# Patient Record
Sex: Male | Born: 1945 | Race: White | Hispanic: No | Marital: Married | Smoking: Current every day smoker
Health system: Southern US, Community
[De-identification: ages and names within clinical notes are randomized; demographics above are authoritative.]

## PROBLEM LIST (undated history)

## (undated) DIAGNOSIS — N402 Nodular prostate without lower urinary tract symptoms: Secondary | ICD-10-CM

## (undated) DIAGNOSIS — F0280 Dementia in other diseases classified elsewhere without behavioral disturbance: Secondary | ICD-10-CM

## (undated) DIAGNOSIS — G309 Alzheimer's disease, unspecified: Principal | ICD-10-CM

## (undated) DIAGNOSIS — K5792 Diverticulitis of intestine, part unspecified, without perforation or abscess without bleeding: Secondary | ICD-10-CM

## (undated) HISTORY — DX: Diverticulitis of intestine, part unspecified, without perforation or abscess without bleeding: K57.92

## (undated) HISTORY — DX: Dementia in other diseases classified elsewhere, unspecified severity, without behavioral disturbance, psychotic disturbance, mood disturbance, and anxiety: F02.80

## (undated) HISTORY — DX: Alzheimer's disease, unspecified: G30.9

## (undated) HISTORY — DX: Nodular prostate without lower urinary tract symptoms: N40.2

## (undated) HISTORY — PX: SQUAMOUS CELL CARCINOMA EXCISION: SHX2433

---

## 2002-02-03 ENCOUNTER — Encounter: Admission: RE | Admit: 2002-02-03 | Discharge: 2002-02-03 | Payer: Self-pay | Admitting: Family Medicine

## 2002-02-03 ENCOUNTER — Encounter: Payer: Self-pay | Admitting: Family Medicine

## 2003-11-04 ENCOUNTER — Encounter: Admission: RE | Admit: 2003-11-04 | Discharge: 2003-11-04 | Payer: Self-pay | Admitting: Family Medicine

## 2003-11-26 HISTORY — PX: SPINE SURGERY: SHX786

## 2010-11-25 HISTORY — PX: CARPAL TUNNEL WITH CUBITAL TUNNEL: SHX5608

## 2012-02-03 DIAGNOSIS — G3184 Mild cognitive impairment, so stated: Secondary | ICD-10-CM | POA: Diagnosis not present

## 2012-02-03 DIAGNOSIS — G309 Alzheimer's disease, unspecified: Secondary | ICD-10-CM | POA: Diagnosis not present

## 2012-02-03 DIAGNOSIS — Z125 Encounter for screening for malignant neoplasm of prostate: Secondary | ICD-10-CM | POA: Diagnosis not present

## 2012-02-03 DIAGNOSIS — F0280 Dementia in other diseases classified elsewhere without behavioral disturbance: Secondary | ICD-10-CM | POA: Diagnosis not present

## 2012-03-30 DIAGNOSIS — H2510 Age-related nuclear cataract, unspecified eye: Secondary | ICD-10-CM | POA: Diagnosis not present

## 2012-03-30 DIAGNOSIS — H40009 Preglaucoma, unspecified, unspecified eye: Secondary | ICD-10-CM | POA: Diagnosis not present

## 2012-05-12 DIAGNOSIS — M25539 Pain in unspecified wrist: Secondary | ICD-10-CM | POA: Diagnosis not present

## 2012-05-26 DIAGNOSIS — M25539 Pain in unspecified wrist: Secondary | ICD-10-CM | POA: Diagnosis not present

## 2012-06-01 DIAGNOSIS — M25539 Pain in unspecified wrist: Secondary | ICD-10-CM | POA: Diagnosis not present

## 2012-06-02 DIAGNOSIS — R198 Other specified symptoms and signs involving the digestive system and abdomen: Secondary | ICD-10-CM | POA: Diagnosis not present

## 2012-06-02 DIAGNOSIS — K620 Anal polyp: Secondary | ICD-10-CM | POA: Diagnosis not present

## 2012-06-02 DIAGNOSIS — L723 Sebaceous cyst: Secondary | ICD-10-CM | POA: Diagnosis not present

## 2012-06-02 DIAGNOSIS — K6289 Other specified diseases of anus and rectum: Secondary | ICD-10-CM | POA: Diagnosis not present

## 2012-06-03 DIAGNOSIS — M25539 Pain in unspecified wrist: Secondary | ICD-10-CM | POA: Diagnosis not present

## 2012-06-08 DIAGNOSIS — M25539 Pain in unspecified wrist: Secondary | ICD-10-CM | POA: Diagnosis not present

## 2012-06-09 DIAGNOSIS — M25539 Pain in unspecified wrist: Secondary | ICD-10-CM | POA: Diagnosis not present

## 2012-06-10 DIAGNOSIS — M25539 Pain in unspecified wrist: Secondary | ICD-10-CM | POA: Diagnosis not present

## 2012-06-15 DIAGNOSIS — M25539 Pain in unspecified wrist: Secondary | ICD-10-CM | POA: Diagnosis not present

## 2012-06-20 DIAGNOSIS — F0280 Dementia in other diseases classified elsewhere without behavioral disturbance: Secondary | ICD-10-CM | POA: Diagnosis not present

## 2012-06-20 DIAGNOSIS — G309 Alzheimer's disease, unspecified: Secondary | ICD-10-CM | POA: Diagnosis not present

## 2012-06-20 DIAGNOSIS — F17200 Nicotine dependence, unspecified, uncomplicated: Secondary | ICD-10-CM | POA: Diagnosis not present

## 2012-06-20 DIAGNOSIS — R197 Diarrhea, unspecified: Secondary | ICD-10-CM | POA: Diagnosis not present

## 2012-06-20 DIAGNOSIS — E860 Dehydration: Secondary | ICD-10-CM | POA: Diagnosis not present

## 2012-06-20 DIAGNOSIS — R112 Nausea with vomiting, unspecified: Secondary | ICD-10-CM | POA: Diagnosis not present

## 2012-07-24 DIAGNOSIS — K5732 Diverticulitis of large intestine without perforation or abscess without bleeding: Secondary | ICD-10-CM | POA: Diagnosis not present

## 2012-08-14 DIAGNOSIS — G309 Alzheimer's disease, unspecified: Secondary | ICD-10-CM | POA: Diagnosis not present

## 2012-08-14 DIAGNOSIS — F0280 Dementia in other diseases classified elsewhere without behavioral disturbance: Secondary | ICD-10-CM | POA: Diagnosis not present

## 2012-12-15 DIAGNOSIS — G4700 Insomnia, unspecified: Secondary | ICD-10-CM | POA: Diagnosis not present

## 2012-12-15 DIAGNOSIS — G309 Alzheimer's disease, unspecified: Secondary | ICD-10-CM | POA: Diagnosis not present

## 2012-12-15 DIAGNOSIS — F0280 Dementia in other diseases classified elsewhere without behavioral disturbance: Secondary | ICD-10-CM | POA: Diagnosis not present

## 2012-12-15 DIAGNOSIS — K4090 Unilateral inguinal hernia, without obstruction or gangrene, not specified as recurrent: Secondary | ICD-10-CM | POA: Diagnosis not present

## 2013-03-17 DIAGNOSIS — G5600 Carpal tunnel syndrome, unspecified upper limb: Secondary | ICD-10-CM | POA: Diagnosis not present

## 2013-03-17 DIAGNOSIS — M25539 Pain in unspecified wrist: Secondary | ICD-10-CM | POA: Diagnosis not present

## 2013-03-17 DIAGNOSIS — S62109A Fracture of unspecified carpal bone, unspecified wrist, initial encounter for closed fracture: Secondary | ICD-10-CM | POA: Diagnosis not present

## 2013-07-13 ENCOUNTER — Ambulatory Visit (INDEPENDENT_AMBULATORY_CARE_PROVIDER_SITE_OTHER): Payer: Medicare Other | Admitting: Nurse Practitioner

## 2013-07-13 ENCOUNTER — Encounter: Payer: Self-pay | Admitting: Nurse Practitioner

## 2013-07-13 VITALS — BP 148/90 | HR 76 | Temp 97.6°F | Resp 18 | Ht 68.5 in | Wt 129.2 lb

## 2013-07-13 DIAGNOSIS — Z139 Encounter for screening, unspecified: Secondary | ICD-10-CM

## 2013-07-13 DIAGNOSIS — F0280 Dementia in other diseases classified elsewhere without behavioral disturbance: Secondary | ICD-10-CM

## 2013-07-13 DIAGNOSIS — F329 Major depressive disorder, single episode, unspecified: Secondary | ICD-10-CM

## 2013-07-13 DIAGNOSIS — E785 Hyperlipidemia, unspecified: Secondary | ICD-10-CM | POA: Diagnosis not present

## 2013-07-13 MED ORDER — CITALOPRAM HYDROBROMIDE 40 MG PO TABS: 40.0000 mg | ORAL_TABLET | Freq: Every day | ORAL | Status: DC

## 2013-07-13 MED ORDER — ZOSTER VACCINE LIVE 19400 UNT/0.65ML ~~LOC~~ SOLR: 0.6500 mL | Freq: Once | SUBCUTANEOUS | Status: DC

## 2013-07-13 NOTE — Patient Instructions (Signed)
Please follow up in 6 weeks for EV  Please check your immunization records for TdaP and Pneumococcal vaccines  Will give you prescription to have shingles vaccine at your pharmacy  Will get lab work today and have put in a referral to GI office for screening colonoscopy  Will increase celexa to 40 mg to help with anxiety and depression

## 2013-07-13 NOTE — Progress Notes (Signed)
Patient ID: Christian [name], male   DOB: 1946-01-27, 67 y.o.   MRN: [id number]   Allergies  Allergen Reactions  . Morphine And Related     Chief Complaint  Patient presents with  . Establish Care    HPI: Patient is a 67 y.o. male seen in the office today to establish care. Here with wife who provides most of the information. Pt with dementia, hx of this for 8 years.  Pt and wife have moved back home from [place]. pts wife has family here and she is aware she will need ongoing help with her husband as his dementia progresses.  Wife reports that she feels like he is depressed. A few months ago he was still driving and working part time. Now that he has moved that has all stopped. He sits on the couch all day and has no need to get up and do anything else. Reports he does get bored. Wife reports she sees more anger and anxiety.  When wife discussed having pt do more activities however pt got exteremly mad and angry when an adult day care was suggested. Pt has been on celexa 20 mg for several years.  Review of Systems:  Review of Systems  Constitutional: Negative for fever, chills and weight loss.  HENT: Negative for hearing loss, ear pain, sore throat and tinnitus.   Eyes: Negative.   Respiratory: Negative for cough and shortness of breath.   Cardiovascular: Negative for chest pain and palpitations.  Gastrointestinal: Positive for abdominal pain (after he drinks coffee on an empty stomach). Negative for heartburn, nausea, vomiting, diarrhea, constipation, blood in stool and melena.  Genitourinary: Negative for dysuria, urgency and frequency.  Musculoskeletal: Negative for myalgias, back pain, joint pain and falls.  Skin: Negative for itching and rash.       Skin tears easy  Neurological: Negative for dizziness, tingling, weakness and headaches.  Endo/Heme/Allergies: Positive for environmental allergies.  Psychiatric/Behavioral: Positive for memory loss (getting lost, not able to complete  a task, has left the stove on). Negative for depression. The patient is not nervous/anxious and does not have insomnia.      Past Medical History  Diagnosis Date  . Alzheimer disease   . Diverticulitis   . Nodular prostate    Past Surgical History  Procedure Laterality Date  . Carpal tunnel with cubital tunnel  2012    Hart Rochester, MD  . Spine surgery  2005    Doristine Section, MD  . Squamous cell carcinoma excision     Social History:   reports that he has been smoking Cigarettes.  He has a 25 pack-year smoking history. He has never used smokeless tobacco. He reports that he drinks about 2.0 ounces of alcohol per week. He reports that he does not use illicit drugs.  Family History  Problem Relation Age of Onset  . Cancer Mother     bone  . Cancer Father     lung  . Dementia Father     Medications: Patient's Medications  New Prescriptions   No medications on file  Previous Medications   CITALOPRAM (CELEXA) 20 MG TABLET    Take 20 mg by mouth daily.   DONEPEZIL (ARICEPT) 10 MG TABLET    Take 10 mg by mouth 2 (two) times daily.   FLUTICASONE (FLONASE) 50 MCG/ACT NASAL SPRAY    Place 2 sprays into the nose 2 (two) times daily.   MEMANTINE (NAMENDA) 10 MG TABLET    Take 10  mg by mouth 2 (two) times daily.    NAPROXEN SODIUM (ANAPROX) 220 MG TABLET    Take 220 mg by mouth daily as needed.   Modified Medications   No medications on file  Discontinued Medications   No medications on file     Physical Exam:  Filed Vitals:   07/13/13 0907  BP: 148/90  Pulse: 76  Temp: 97.6 F (36.4 C)  TempSrc: Oral  Resp: 18  Height: 5' 8.5" (1.74 m)  Weight: 129 lb 3.2 oz (58.605 kg)  SpO2: 95%    Physical Exam  Constitutional: He is well-developed, well-nourished, and in no distress. No distress.  HENT:  Head: Normocephalic and atraumatic.  Right Ear: External ear normal.  Left Ear: External ear normal.  Nose: Nose normal.  Mouth/Throat: Oropharynx is clear and  moist. No oropharyngeal exudate.  Eyes: Conjunctivae and EOM are normal. Pupils are equal, round, and reactive to light.  Neck: Normal range of motion. Neck supple. No tracheal deviation present. No thyromegaly present.  Cardiovascular: Normal rate, regular rhythm and normal heart sounds.   Pulmonary/Chest: Effort normal and breath sounds normal. No respiratory distress. He has no wheezes.  Abdominal: Soft. Bowel sounds are normal. He exhibits no distension. There is no tenderness.  Musculoskeletal: Normal range of motion. He exhibits no edema and no tenderness.  Lymphadenopathy:    He has no cervical adenopathy.  Neurological: He is alert. No cranial nerve deficit. Gait normal.  Skin: Skin is warm and dry. He is not diaphoretic.  Psychiatric: Affect normal.    Assessment/Plan 1. Alzheimer disease MMSE of 17/30 indicating moderate alzheimer's disease; currently on namenda and aricept t - CBC With differential/Platelet - Comprehensive metabolic panel  2. Screening To verify if he has received his pneumococcal and tdap vaccine. Will get referral to GI for screening colonoscopy.   - Ambulatory referral to Gastroenterology - zoster vaccine live, PF, (ZOSTAVAX) 08657 UNT/0.65ML injection; Inject 19,400 Units into the skin once.  Dispense: 1 each; Refill: 0  3. Depression Worsening depression, pt states hes bored has no need to do anything. Will increase celexa, encouraged activities so he stays motivated and things to keep him interactive.  - citalopram (CELEXA) 40 MG tablet; Take 1 tablet (40 mg total) by mouth daily.  Dispense: 30 tablet; Refill: 1  4. Other and unspecified hyperlipidemia Reports history of high cholesterol but no needed for medications. Will get screening lipid panel today  - Lipid panel  Code status and living will discussed with pt and wife. Pt and wife have already discusses this previously and both report pt to be a DNR; form filled out and MOST form given to pt  and wife for them to look over

## 2013-07-14 ENCOUNTER — Ambulatory Visit: Payer: Self-pay | Admitting: Nurse Practitioner

## 2013-07-14 LAB — COMPREHENSIVE METABOLIC PANEL
AST: 14 IU/L (ref 0–40)
Albumin/Globulin Ratio: 1.9 (ref 1.1–2.5)
Albumin: 4.2 g/dL (ref 3.6–4.8)
Alkaline Phosphatase: 75 IU/L (ref 39–117)
BUN/Creatinine Ratio: 20 (ref 10–22)
BUN: 15 mg/dL (ref 8–27)
CO2: 28 mmol/L (ref 18–29)
Creatinine, Ser: 0.76 mg/dL (ref 0.76–1.27)
GFR calc Af Amer: 108 mL/min/{1.73_m2} (ref >59)
Globulin, Total: 2.2 g/dL (ref 1.5–4.5)
Sodium: 142 mmol/L (ref 134–144)
Total Bilirubin: 0.3 mg/dL (ref 0.0–1.2)

## 2013-07-14 LAB — CBC WITH DIFFERENTIAL
Eos: 2 % (ref 0–5)
HCT: 40.7 % (ref 37.5–51.0)
Immature Granulocytes: 0 % (ref 0–2)
Lymphocytes Absolute: 1.9 10*3/uL (ref 0.7–3.1)
MCH: 30.5 pg (ref 26.6–33.0)
MCV: 90 fL (ref 79–97)
Monocytes Absolute: 0.4 10*3/uL (ref 0.1–0.9)
Monocytes: 6 % (ref 4–12)
Neutrophils Absolute: 4.1 10*3/uL (ref 1.4–7.0)
Platelets: 229 10*3/uL (ref 150–379)
RBC: 4.53 x10E6/uL (ref 4.14–5.80)
RDW: 14 % (ref 12.3–15.4)

## 2013-07-14 LAB — LIPID PANEL
Chol/HDL Ratio: 3.7 ratio units (ref 0.0–5.0)
LDL Calculated: 117 mg/dL — ABNORMAL HIGH (ref 0–99)

## 2013-07-28 ENCOUNTER — Ambulatory Visit (AMBULATORY_SURGERY_CENTER): Payer: Self-pay | Admitting: *Deleted

## 2013-07-28 VITALS — Ht 68.5 in | Wt 130.8 lb

## 2013-07-28 DIAGNOSIS — Z1211 Encounter for screening for malignant neoplasm of colon: Secondary | ICD-10-CM

## 2013-07-28 MED ORDER — MOVIPREP 100 G PO SOLR: ORAL | Status: DC

## 2013-07-28 NOTE — Progress Notes (Signed)
No allergies to eggs or soy. No problems with anesthesia.  Pt here in [place] with sister in law Cindy Helmes. Pt has Alzheimer's.  Instructions for procedure reviewed with Cindy Helmes.  Telephone consent for procedure given by pt's wife; Kiptyn Rafuse.

## 2013-08-11 ENCOUNTER — Encounter: Payer: Self-pay | Admitting: Internal Medicine

## 2013-08-11 ENCOUNTER — Ambulatory Visit (AMBULATORY_SURGERY_CENTER): Payer: Medicare Other | Admitting: Internal Medicine

## 2013-08-11 VITALS — BP 155/78 | HR 48 | Temp 96.2°F | Resp 16 | Ht 68.0 in | Wt 130.0 lb

## 2013-08-11 DIAGNOSIS — D126 Benign neoplasm of colon, unspecified: Secondary | ICD-10-CM

## 2013-08-11 DIAGNOSIS — Z1211 Encounter for screening for malignant neoplasm of colon: Secondary | ICD-10-CM

## 2013-08-11 MED ORDER — SODIUM CHLORIDE 0.9 % IV SOLN: 500.0000 mL | INTRAVENOUS | Status: DC

## 2013-08-11 NOTE — Progress Notes (Signed)
Called to room to assist during endoscopic procedure.  Patient ID and intended procedure confirmed with present staff. Received instructions for my participation in the procedure from the performing physician.  

## 2013-08-11 NOTE — Progress Notes (Signed)
Patient did not experience any of the following events: a burn prior to discharge; a fall within the facility; wrong site/side/patient/procedure/implant event; or a hospital transfer or hospital admission upon discharge from the facility. (G8907)Patient did not have preoperative order for IV antibiotic SSI prophylaxis. (G8918) ewm 

## 2013-08-11 NOTE — Op Note (Signed)
[facility] [address] [place], [address]   COLONOSCOPY PROCEDURE REPORT  PATIENT: Delano, Frate  MR#: [id number] BIRTHDATE: 06/26/1946 , 67  yrs. old GENDER: Male ENDOSCOPIST: Hart Carwin, MD REFERRED MW:UXLKGMW Karam,NP PROCEDURE DATE:  08/11/2013 PROCEDURE:   Colonoscopy with snare polypectomy and Colonoscopy with cold biopsy polypectomy First Screening Colonoscopy - Avg.  risk and is 50 yrs.  old or older Yes.  Prior Negative Screening - Now for repeat screening. N/A  History of Adenoma - Now for follow-up colonoscopy & has been > or = to 3 yrs.  N/A  Polyps Removed Today? Yes. ASA CLASS:   Class II INDICATIONS:Average risk patient for colon cancer. MEDICATIONS: MAC sedation, administered by CRNA and propofol (Diprivan) 200mg  IV  DESCRIPTION OF PROCEDURE:   After the risks benefits and alternatives of the procedure were thoroughly explained, informed consent was obtained.  A digital rectal exam revealed no abnormalities of the rectum.   The LB [id number]-[id number]  endoscope was introduced through the anus and advanced to the cecum, which was identified by both the appendix and ileocecal valve. No adverse events experienced.   The quality of the prep was good, using MoviPrep  The instrument was then slowly withdrawn as the colon was fully examined.      COLON FINDINGS: Three polypoid shaped semi-pedunculated polyps ranging between 5-66mm in size were found. at 20cm (3 mm),60cm (7 mm) and 100cm (6 mm)  A polypectomy was performed with cold forceps and with a cold snare.  The resection was complete and the polyp tissue was completely retrieved.  Retroflexed views revealed no abnormalities. The time to cecum=9 minutes 5 seconds.  Withdrawal time=8 minutes 35 seconds.  There qwere small internal hemorrhoids The scope was withdrawn and the procedure completed. COMPLICATIONS: There were no complications.  ENDOSCOPIC IMPRESSION: Three  semi-pedunculated polyps ranging between 5-75mm in size were found; polypectomy was performed with cold forceps x1 and with a cold snare x2 Grade 1 internal hemorrhoids  RECOMMENDATIONS: 1.  Await pathology results 2.  High fiber diet 3.   recall colonoscopy pending Bx's   eSigned:  Hart Carwin, MD 08/11/2013 11:00 AM   cc:   PATIENT NAME:  Teofilo, Lupinacci MR#: [id number]

## 2013-08-11 NOTE — Progress Notes (Signed)
  [facility] Anesthesia Post-op Note  Patient: Christian [name]  Procedure(s) Performed: colonoscopy  Patient Location: [facility] - Recovery Area  Anesthesia Type: Deep Sedation/Propofol  Level of Consciousness: awake, oriented and patient cooperative  Airway and Oxygen Therapy: Patient Spontanous Breathing  Post-op Pain: none  Post-op Assessment:  Post-op Vital signs reviewed, Patient's Cardiovascular Status Stable, Respiratory Function Stable, Patent Airway, No signs of Nausea or vomiting and Pain level controlled  Post-op Vital Signs: Reviewed and stable  Complications: No apparent anesthesia complications  [place]re Matson E 10:55 AM

## 2013-08-11 NOTE — Patient Instructions (Addendum)
YOU HAD AN ENDOSCOPIC PROCEDURE TODAY AT THE [facility]: Refer to the procedure report that was given to you for any specific questions about what was found during the examination.  If the procedure report does not answer your questions, please call your gastroenterologist to clarify.  If you requested that your care partner not be given the details of your procedure findings, then the procedure report has been included in a sealed envelope for you to review at your convenience later.  YOU SHOULD EXPECT: Some feelings of bloating in the abdomen. Passage of more gas than usual.  Walking can help get rid of the air that was put into your GI tract during the procedure and reduce the bloating. If you had a lower endoscopy (such as a colonoscopy or flexible sigmoidoscopy) you may notice spotting of blood in your stool or on the toilet paper. If you underwent a bowel prep for your procedure, then you may not have a normal bowel movement for a few days.  DIET: Your first meal following the procedure should be a light meal and then it is ok to progress to your normal diet.  A half-sandwich or bowl of soup is an example of a good first meal.  Heavy or fried foods are harder to digest and may make you feel nauseous or bloated.  Likewise meals heavy in dairy and vegetables can cause extra gas to form and this can also increase the bloating.  Drink plenty of fluids but you should avoid alcoholic beverages for 24 hours.  ACTIVITY: Your care partner should take you home directly after the procedure.  You should plan to take it easy, moving slowly for the rest of the day.  You can resume normal activity the day after the procedure however you should NOT DRIVE or use heavy machinery for 24 hours (because of the sedation medicines used during the test).    SYMPTOMS TO REPORT IMMEDIATELY: A gastroenterologist can be reached at any hour.  During normal business hours, 8:30 AM to 5:00 PM Monday through Friday,  call [phone].  After hours and on weekends, please call the GI answering service at [phone]  Emergency number who will take a message and have the physician on call contact you.   Following lower endoscopy (colonoscopy or flexible sigmoidoscopy):  Excessive amounts of blood in the stool-1/2 cup of blood or more or lots of blood clots  Significant tenderness or worsening of abdominal pains  Swelling of the abdomen that is new, acute  Fever of 100F or higher  FOLLOW UP: If any biopsies were taken you will be contacted by phone or by letter within the next 1-3 weeks.  Call your gastroenterologist if you have not heard about the biopsies in 3 weeks.  Our staff will call the home number listed on your records the next business day following your procedure to check on you and address any questions or concerns that you may have at that time regarding the information given to you following your procedure. This is a courtesy call and so if there is no answer at the home number and we have not heard from you through the emergency physician on call, we will assume that you have returned to your regular daily activities without incident.  SIGNATURES/CONFIDENTIALITY: You and/or your care partner have signed paperwork which will be entered into your electronic medical record.  These signatures attest to the fact that that the information above on your After Visit Summary has  been reviewed and is understood.  Full responsibility of the confidentiality of this discharge information lies with you and/or your care-partner.  Handout on polyps

## 2013-08-12 ENCOUNTER — Telehealth: Payer: Self-pay

## 2013-08-12 NOTE — Telephone Encounter (Signed)
Left message on answering machine. 

## 2013-08-17 ENCOUNTER — Encounter: Payer: Self-pay | Admitting: Internal Medicine

## 2013-08-23 ENCOUNTER — Encounter: Payer: Self-pay | Admitting: Nurse Practitioner

## 2013-09-14 ENCOUNTER — Encounter: Payer: Medicare Other | Admitting: Nurse Practitioner

## 2013-09-15 ENCOUNTER — Ambulatory Visit (INDEPENDENT_AMBULATORY_CARE_PROVIDER_SITE_OTHER): Payer: Medicare Other | Admitting: Nurse Practitioner

## 2013-09-15 ENCOUNTER — Encounter: Payer: Self-pay | Admitting: Nurse Practitioner

## 2013-09-15 VITALS — BP 142/80 | HR 59 | Temp 97.2°F | Resp 16 | Ht 68.75 in | Wt 133.2 lb

## 2013-09-15 DIAGNOSIS — K219 Gastro-esophageal reflux disease without esophagitis: Secondary | ICD-10-CM

## 2013-09-15 DIAGNOSIS — F329 Major depressive disorder, single episode, unspecified: Secondary | ICD-10-CM

## 2013-09-15 DIAGNOSIS — Z23 Encounter for immunization: Secondary | ICD-10-CM

## 2013-09-15 DIAGNOSIS — F0280 Dementia in other diseases classified elsewhere without behavioral disturbance: Secondary | ICD-10-CM

## 2013-09-15 MED ORDER — PNEUMOCOCCAL VAC POLYVALENT 25 MCG/0.5ML IJ INJ: 0.5000 mL | INJECTION | Freq: Once | INTRAMUSCULAR | Status: DC

## 2013-09-15 MED ORDER — MEMANTINE HCL ER 28 MG PO CP24: 28.0000 mg | ORAL_CAPSULE | Freq: Every day | ORAL | Status: DC

## 2013-09-15 MED ORDER — PANTOPRAZOLE SODIUM 40 MG PO TBEC: 40.0000 mg | DELAYED_RELEASE_TABLET | Freq: Every day | ORAL | Status: DC

## 2013-09-15 NOTE — Progress Notes (Signed)
Patient ID: Christian [name], male   DOB: [birth year], 67 y.o.   MRN: [id number]   Allergies  Allergen Reactions  . Morphine And Related     Per pt: unknown    Chief Complaint  Patient presents with  . Annual Exam    physical with no labs prior, fasting   . Abdominal Pain    constant gurgling x several weeks  . Immunizations    pnuemo ?    HPI: Patient is a 67 y.o. male seen in the office today for extended visit.  Pt with dementia; complaints about stomach all the time per wife; pt reports its very aggravating most the time not painful most of the time but sometime it is painful; had colonoscopy and per wife nothing showed up; no N/V, no constipation or diarrhea. Pt reports no interest in eating. Did not eat this morning, does not eat regularly; wife this stomach discomfort is related to this.  Last visit celexa was increased due to depression; pt reports he is not depressed; wife reports he is not as angry and overall mood is better   Review of Systems:  Review of Systems  Constitutional: Negative for fever, chills and weight loss.  HENT: Negative for ear pain, hearing loss, sore throat and tinnitus.   Eyes: Negative.        Goes to eye doctor yearly  Respiratory: Negative for cough and shortness of breath.   Cardiovascular: Negative for chest pain and palpitations.  Gastrointestinal: Negative for heartburn, nausea, vomiting, abdominal pain, diarrhea and constipation.       Reports gurgling; does not eat regularly; eats brunch and dinner   Genitourinary: Negative for dysuria, urgency and frequency.  Musculoskeletal: Negative for back pain, falls, joint pain and myalgias.  Skin: Negative for itching and rash.  Neurological: Negative for dizziness, tingling, weakness and headaches.  Endo/Heme/Allergies: Positive for environmental allergies.  Psychiatric/Behavioral: Positive for memory loss (getting lost, not able to complete a task, has left the stove on). Negative for depression.  The patient is not nervous/anxious and does not have insomnia.        Memory loss stable    Past Medical History  Diagnosis Date  . Alzheimer disease   . Diverticulitis   . Nodular prostate    Past Surgical History  Procedure Laterality Date  . Carpal tunnel with cubital tunnel  2012    Hart Rochester, MD  . Spine surgery  2005    Doristine Section, MD  . Squamous cell carcinoma excision     Social History:   reports that he has been smoking Cigarettes.  He has a 25 pack-year smoking history. He has never used smokeless tobacco. He reports that he drinks about 2.0 ounces of alcohol per week. He reports that he does not use illicit drugs.  Family History  Problem Relation Age of Onset  . Cancer Mother     bone  . Cancer Father     lung  . Dementia Father     Medications: Patient's Medications  New Prescriptions   No medications on file  Previous Medications   CITALOPRAM (CELEXA) 40 MG TABLET    Take 1 tablet (40 mg total) by mouth daily.   DONEPEZIL (ARICEPT) 10 MG TABLET    Take 10 mg by mouth 2 (two) times daily.   FLUTICASONE (FLONASE) 50 MCG/ACT NASAL SPRAY    Place 2 sprays into the nose 2 (two) times daily.   MEMANTINE (NAMENDA) 10 MG TABLET  Take 10 mg by mouth 2 (two) times daily.    NAPROXEN SODIUM (ANAPROX) 220 MG TABLET    Take 220 mg by mouth daily as needed.    ZOSTER VACCINE LIVE, PF, (ZOSTAVAX) 24401 UNT/0.65ML INJECTION    Inject 19,400 Units into the skin once.  Modified Medications   No medications on file  Discontinued Medications   No medications on file     Physical Exam:  Filed Vitals:   09/15/13 1119  BP: 142/80  Pulse: 59  Temp: 97.2 F (36.2 C)  TempSrc: Oral  Resp: 16  Height: 5' 8.75" (1.746 m)  Weight: 133 lb 3.2 oz (60.419 kg)  SpO2: 98%    Physical Exam  Vitals reviewed. Constitutional: He is well-developed, well-nourished, and in no distress.  thin  HENT:  Head: Normocephalic and atraumatic.  Right Ear: External ear  normal.  Left Ear: External ear normal.  Nose: Nose normal.  Mouth/Throat: Oropharynx is clear and moist. No oropharyngeal exudate.  Eyes: Conjunctivae and EOM are normal. Pupils are equal, round, and reactive to light.  Neck: Normal range of motion. Neck supple. No thyromegaly present.  Cardiovascular: Normal rate, regular rhythm and normal heart sounds.   Pulmonary/Chest: Effort normal and breath sounds normal. No respiratory distress.  Abdominal: Soft. Normal appearance. He exhibits no distension and no abdominal bruit. Bowel sounds are hyperactive. There is tenderness in the epigastric area. There is no rigidity, no rebound, no guarding, no tenderness at McBurney's point and negative Murphy's sign. A hernia is present. Hernia confirmed positive in the right inguinal area.    Genitourinary: Rectum normal, prostate normal, testes/scrotum normal and penis normal. No discharge found.  Musculoskeletal: Normal range of motion. He exhibits no edema and no tenderness.  Lymphadenopathy:    He has no cervical adenopathy.  Neurological: He is alert. He has normal motor skills. No cranial nerve deficit. Gait normal.  Skin: Skin is warm and dry.  Psychiatric: Mood and affect normal. He exhibits abnormal recent memory.     Labs reviewed: Basic Metabolic Panel:  Recent Labs  02/72/53 1041  NA 142  K 4.4  CL 102  CO2 28  GLUCOSE 85  BUN 15  CREATININE 0.76  CALCIUM 9.2   Liver Function Tests:  Recent Labs  07/13/13 1041  AST 14  ALT 8  ALKPHOS 75  BILITOT 0.3  PROT 6.4   No results found for this basename: LIPASE, AMYLASE,  in the last 8760 hours No results found for this basename: AMMONIA,  in the last 8760 hours CBC:  Recent Labs  07/13/13 1041  WBC 6.6  NEUTROABS 4.1  HGB 13.8  HCT 40.7  MCV 90  PLT 229   Lipid Panel:  Recent Labs  07/13/13 1041  HDL 51  LDLCALC 117*  TRIG 93  CHOLHDL 3.7    Assessment/Plan 1. GERD (gastroesophageal reflux  disease) -abdominal discomfort; does not eat regularly with dementia hard to get accurate history  -encouraged to eat regular meals and snack throughout the day  - pantoprazole (PROTONIX) 40 MG tablet; Take 1 tablet (40 mg total) by mouth daily.  Dispense: 30 tablet; Refill: 3 - to notify if this gets worse or does not improve with medication; had colonoscopy for screening may need to go back to GI if no improvement  2. Alzheimer disease -stable at this time -cont Aricept and namenda (will change namenda to XR) - Memantine HCl ER 28 MG CP24; Take 28 mg by mouth daily.  Dispense: 30  capsule; Refill: 3  3. Vaccine for streptococcus pneumoniae  -prescription printed unsure if he has had this vaccine in the past may take to pharmacy - pneumococcal 23 valent vaccine (PNU-IMMUNE) 25 MCG/0.5ML injection; Inject 0.5 mLs into the muscle once.  Dispense: 0.5 mL; Refill: 0  4. Depression -improved on celexa; will cont current medications   Will have pt follow up in 3 months for routine follow up with Glade Lloyd MD

## 2013-09-15 NOTE — Patient Instructions (Addendum)
To follow up in 3 months with DR Glade Lloyd or sooner if needed due to stomach  Try to eat 3 meals a day with snacks in between May use ensure  Will start omeprazole daily to help with stomach Start namenda XR 28 mg daily once you are done with namenda 10 mg twice daily   Inguinal Hernia, Adult Muscles help keep everything in the body in its proper place. But if a weak spot in the muscles develops, something can poke through. That is called a hernia. When this happens in the lower part of the belly (abdomen), it is called an inguinal hernia. (It takes its name from a part of the body in this region called the inguinal canal.) A weak spot in the wall of muscles lets some fat or part of the small intestine bulge through. An inguinal hernia can develop at any age. Men get them more often than women. CAUSES  In adults, an inguinal hernia develops over time.  It can be triggered by:  Suddenly straining the muscles of the lower abdomen.  Lifting heavy objects.  Straining to have a bowel movement. Difficult bowel movements (constipation) can lead to this.  Constant coughing. This may be caused by smoking or lung disease.  Being overweight.  Being pregnant.  Working at a job that requires long periods of standing or heavy lifting.  Having had an inguinal hernia before. One type can be an emergency situation. It is called a strangulated inguinal hernia. It develops if part of the small intestine slips through the weak spot and cannot get back into the abdomen. The blood supply can be cut off. If that happens, part of the intestine may die. This situation requires emergency surgery. SYMPTOMS  Often, a small inguinal hernia has no symptoms. It is found when a healthcare provider does a physical exam. Larger hernias usually have symptoms.   In adults, symptoms may include:  A lump in the groin. This is easier to see when the person is standing. It might disappear when lying down.  In men, a  lump in the scrotum.  Pain or burning in the groin. This occurs especially when lifting, straining or coughing.  A dull ache or feeling of pressure in the groin.  Signs of a strangulated hernia can include:  A bulge in the groin that becomes very painful and tender to the touch.  A bulge that turns red or purple.  Fever, nausea and vomiting.  Inability to have a bowel movement or to pass gas. DIAGNOSIS  To decide if you have an inguinal hernia, a healthcare provider will probably do a physical examination.  This will include asking questions about any symptoms you have noticed.  The healthcare provider might feel the groin area and ask you to cough. If an inguinal hernia is felt, the healthcare provider may try to slide it back into the abdomen.  Usually no other tests are needed. TREATMENT  Treatments can vary. The size of the hernia makes a difference. Options include:  Watchful waiting. This is often suggested if the hernia is small and you have had no symptoms.  No medical procedure will be done unless symptoms develop.  You will need to watch closely for symptoms. If any occur, contact your healthcare provider right away.  Surgery. This is used if the hernia is larger or you have symptoms.  Open surgery. This is usually an outpatient procedure (you will not stay overnight in a hospital). An cut (incision) is made  through the skin in the groin. The hernia is put back inside the abdomen. The weak area in the muscles is then repaired by herniorrhaphy or hernioplasty. Herniorrhaphy: in this type of surgery, the weak muscles are sewn back together. Hernioplasty: a patch or mesh is used to close the weak area in the abdominal wall.  Laparoscopy. In this procedure, a surgeon makes small incisions. A thin tube with a tiny video camera (called a laparoscope) is put into the abdomen. The surgeon repairs the hernia with mesh by looking with the video camera and using two long  instruments. HOME CARE INSTRUCTIONS   After surgery to repair an inguinal hernia:  You will need to take pain medicine prescribed by your healthcare provider. Follow all directions carefully.  You will need to take care of the wound from the incision.  Your activity will be restricted for awhile. This will probably include no heavy lifting for several weeks. You also should not do anything too active for a few weeks. When you can return to work will depend on the type of job that you have.  During "watchful waiting" periods, you should:  Maintain a healthy weight.  Eat a diet high in fiber (fruits, vegetables and whole grains).  Drink plenty of fluids to avoid constipation. This means drinking enough water and other liquids to keep your urine clear or pale yellow.  Do not lift heavy objects.  Do not stand for long periods of time.  Quit smoking. This should keep you from developing a frequent cough. SEEK MEDICAL CARE IF:   A bulge develops in your groin area.  You feel pain, a burning sensation or pressure in the groin. This might be worse if you are lifting or straining.  You develop a fever of more than 100.5 F (38.1 C). SEEK IMMEDIATE MEDICAL CARE IF:   Pain in the groin increases suddenly.  A bulge in the groin gets bigger suddenly and does not go down.  For men, there is sudden pain in the scrotum. Or, the size of the scrotum increases.  A bulge in the groin area becomes red or purple and is painful to touch.  You have nausea or vomiting that does not go away.  You feel your heart beating much faster than normal.  You cannot have a bowel movement or pass gas.  You develop a fever of more than 102.0 F (38.9 C). Document Released: 03/30/2009 Document Revised: 02/03/2012 Document Reviewed: 03/30/2009 [facility] Patient Information 2014 [place], [place].

## 2013-09-17 ENCOUNTER — Ambulatory Visit: Payer: Medicare Other

## 2013-09-17 DIAGNOSIS — Z23 Encounter for immunization: Secondary | ICD-10-CM | POA: Diagnosis not present

## 2013-09-22 ENCOUNTER — Other Ambulatory Visit: Payer: Self-pay | Admitting: *Deleted

## 2013-09-22 MED ORDER — MEMANTINE HCL 10 MG PO TABS: 10.0000 mg | ORAL_TABLET | Freq: Two times a day (BID) | ORAL | Status: DC

## 2013-09-28 ENCOUNTER — Other Ambulatory Visit: Payer: Self-pay | Admitting: Nurse Practitioner

## 2013-12-15 ENCOUNTER — Encounter: Payer: Self-pay | Admitting: Internal Medicine

## 2013-12-15 ENCOUNTER — Ambulatory Visit (INDEPENDENT_AMBULATORY_CARE_PROVIDER_SITE_OTHER): Payer: Medicare Other | Admitting: Internal Medicine

## 2013-12-15 VITALS — BP 120/78 | HR 62 | Temp 98.4°F | Wt 137.0 lb

## 2013-12-15 DIAGNOSIS — K219 Gastro-esophageal reflux disease without esophagitis: Secondary | ICD-10-CM

## 2013-12-15 DIAGNOSIS — F0280 Dementia in other diseases classified elsewhere without behavioral disturbance: Secondary | ICD-10-CM

## 2013-12-15 DIAGNOSIS — F329 Major depressive disorder, single episode, unspecified: Secondary | ICD-10-CM | POA: Diagnosis not present

## 2013-12-15 DIAGNOSIS — L989 Disorder of the skin and subcutaneous tissue, unspecified: Secondary | ICD-10-CM

## 2013-12-15 DIAGNOSIS — F3289 Other specified depressive episodes: Secondary | ICD-10-CM | POA: Diagnosis not present

## 2013-12-15 DIAGNOSIS — G309 Alzheimer's disease, unspecified: Secondary | ICD-10-CM

## 2013-12-15 DIAGNOSIS — N529 Male erectile dysfunction, unspecified: Secondary | ICD-10-CM | POA: Diagnosis not present

## 2013-12-15 DIAGNOSIS — F0393 Unspecified dementia, unspecified severity, with mood disturbance: Secondary | ICD-10-CM

## 2013-12-15 MED ORDER — SILDENAFIL CITRATE 50 MG PO TABS: 50.0000 mg | ORAL_TABLET | Freq: Every day | ORAL | Status: DC | PRN

## 2013-12-15 NOTE — Progress Notes (Signed)
Patient ID: Christian [name], male   DOB: Feb 11, 1946, 68 y.o.   MRN: [id number]    Chief Complaint  Patient presents with  . Medical Managment of Chronic Issues    3 month follow-up   . Skin Problem    Examine raised area   . Medication Management    Discuss which dose of Namenda patient sholuld take  . Medication Management    Request RX for erectile dysfunction   Allergies  Allergen Reactions  . Morphine And Related     Per pt: unknown   HPI 68 y/o male pt here for follow up. He has dementia but is pleasant and conversant at baseline. He has noticed raised area on his skin under his eye and behind his ear and on his scalp. It itches and has increased in size.  He is close to running out of his namenda 10 mg bid and namenda xr is expensive with his insurance He has erectile dysfunction and would like to try viagra Complaint with other medication No other concerns Wife present with him  Review of Systems  Constitutional: Negative for fever, chills, weight loss, malaise/fatigue and diaphoresis. weight stable HENT: Negative for congestion, hearing loss and sore throat.   Eyes: Negative for blurred vision, double vision and discharge.  Respiratory: Negative for cough, sputum production, shortness of breath and wheezing.   Cardiovascular: Negative for chest pain, palpitations, orthopnea and leg swelling.  Gastrointestinal: Negative for heartburn, nausea, vomiting, abdominal pain, diarrhea and constipation.  Genitourinary: Negative for dysuria, urgency, frequency and flank pain.  Musculoskeletal: Negative for back pain, falls, joint pain and myalgias.  Skin: see hpi Neurological: Negative for dizziness, tingling, focal weakness and headaches.  Psychiatric/Behavioral: Negative for depression.The patient is not nervous/anxious.    Past Medical History  Diagnosis Date  . Alzheimer disease   . Diverticulitis   . Nodular prostate    Current Outpatient Prescriptions on File Prior to  Visit  Medication Sig Dispense Refill  . citalopram (CELEXA) 40 MG tablet TAKE 1 TABLET (40 MG TOTAL) BY MOUTH DAILY.  30 tablet  2  . donepezil (ARICEPT) 10 MG tablet Take 10 mg by mouth 2 (two) times daily.      . fluticasone (FLONASE) 50 MCG/ACT nasal spray Place 2 sprays into the nose 2 (two) times daily.      . naproxen sodium (ANAPROX) 220 MG tablet Take 220 mg by mouth daily as needed.       . pantoprazole (PROTONIX) 40 MG tablet Take 1 tablet (40 mg total) by mouth daily.  30 tablet  3  . pneumococcal 23 valent vaccine (PNU-IMMUNE) 25 MCG/0.5ML injection Inject 0.5 mLs into the muscle once.  0.5 mL  0  . Memantine HCl ER 28 MG CP24 Take 28 mg by mouth daily.  30 capsule  3   No current facility-administered medications on file prior to visit.    Physical exam BP 120/78  Pulse 62  Temp(Src) 98.4 F (36.9 C) (Oral)  Wt 137 lb (62.143 kg)  SpO2 99%  General- elderly male in no acute distress, thin bulit Head- atraumatic, normocephalic Eyes- PERRLA, EOMI, no pallor, no icterus Neck- no lymphadenopathy, no thyromegaly, no jugular vein distension, no carotid bruit Cardiovascular- normal s1,s2, no murmurs/ rubs/ gallops Respiratory- bilateral clear to auscultation, no wheeze, no rhonchi, no crackles Abdomen- bowel sounds present, soft, non tender Musculoskeletal- able to move all 4 extremities, steady gait, no use of assistive device Neurological- no focal deficit, normal reflexes Psychiatry-  alert and oriented to person, place. normal mood and affect   Labs- CBC    Component Value Date/Time   WBC 6.6 07/13/2013 1041   RBC 4.53 07/13/2013 1041   HGB 13.8 07/13/2013 1041   HCT 40.7 07/13/2013 1041   PLT 229 07/13/2013 1041   MCV 90 07/13/2013 1041   MCH 30.5 07/13/2013 1041   MCHC 33.9 07/13/2013 1041   RDW 14.0 07/13/2013 1041   LYMPHSABS 1.9 07/13/2013 1041   EOSABS 0.1 07/13/2013 1041   BASOSABS 0.1 07/13/2013 1041    CMP     Component Value Date/Time   NA 142 07/13/2013  1041   K 4.4 07/13/2013 1041   CL 102 07/13/2013 1041   CO2 28 07/13/2013 1041   GLUCOSE 85 07/13/2013 1041   BUN 15 07/13/2013 1041   CREATININE 0.76 07/13/2013 1041   CALCIUM 9.2 07/13/2013 1041   PROT 6.4 07/13/2013 1041   AST 14 07/13/2013 1041   ALT 8 07/13/2013 1041   ALKPHOS 75 07/13/2013 1041   BILITOT 0.3 07/13/2013 1041   GFRNONAA 94 07/13/2013 1041   GFRAA 108 07/13/2013 1041   Assessment/plan  1. Erectile dysfunction Will start him on 50 mg viagra daily prn, common side effect explained  2. Alzheimer's dementia without behavioral disturbance Continue donepezil. New script for namenda xr with free 1 month trial card provided. If cost limits this, will change to exelon patch  3. GERD (gastroesophageal reflux disease) Continue protonix  4. Depression due to dementia Continue celexa for now, monitor clinically  5. Skin lesion of face Raised skin lesion with irregular border in sun exposed area. Possible SCC vs seborrheic keratosis. - Ambulatory referral to Dermatology for possible skin biopsy

## 2014-01-06 ENCOUNTER — Other Ambulatory Visit: Payer: Self-pay | Admitting: Nurse Practitioner

## 2014-01-10 DIAGNOSIS — D485 Neoplasm of uncertain behavior of skin: Secondary | ICD-10-CM | POA: Diagnosis not present

## 2014-06-16 ENCOUNTER — Encounter: Payer: Self-pay | Admitting: Nurse Practitioner

## 2014-06-16 ENCOUNTER — Ambulatory Visit (INDEPENDENT_AMBULATORY_CARE_PROVIDER_SITE_OTHER): Payer: Medicare Other | Admitting: Nurse Practitioner

## 2014-06-16 VITALS — BP 138/88 | HR 65 | Temp 98.2°F | Resp 18 | Ht 68.0 in | Wt 139.0 lb

## 2014-06-16 DIAGNOSIS — F0280 Dementia in other diseases classified elsewhere without behavioral disturbance: Secondary | ICD-10-CM

## 2014-06-16 DIAGNOSIS — G309 Alzheimer's disease, unspecified: Secondary | ICD-10-CM

## 2014-06-16 DIAGNOSIS — F329 Major depressive disorder, single episode, unspecified: Secondary | ICD-10-CM

## 2014-06-16 DIAGNOSIS — J301 Allergic rhinitis due to pollen: Secondary | ICD-10-CM

## 2014-06-16 DIAGNOSIS — K219 Gastro-esophageal reflux disease without esophagitis: Secondary | ICD-10-CM | POA: Insufficient documentation

## 2014-06-16 DIAGNOSIS — E785 Hyperlipidemia, unspecified: Secondary | ICD-10-CM

## 2014-06-16 DIAGNOSIS — K2100 Gastro-esophageal reflux disease with esophagitis, without bleeding: Secondary | ICD-10-CM | POA: Diagnosis not present

## 2014-06-16 DIAGNOSIS — F3289 Other specified depressive episodes: Secondary | ICD-10-CM

## 2014-06-16 DIAGNOSIS — N529 Male erectile dysfunction, unspecified: Secondary | ICD-10-CM | POA: Diagnosis not present

## 2014-06-16 DIAGNOSIS — F0393 Unspecified dementia, unspecified severity, with mood disturbance: Secondary | ICD-10-CM

## 2014-06-16 MED ORDER — PANTOPRAZOLE SODIUM 40 MG PO TBEC: 40.0000 mg | DELAYED_RELEASE_TABLET | Freq: Every day | ORAL | Status: DC

## 2014-06-16 MED ORDER — FLUTICASONE PROPIONATE 50 MCG/ACT NA SUSP: 2.0000 | Freq: Two times a day (BID) | NASAL | Status: AC | PRN

## 2014-06-16 MED ORDER — DONEPEZIL HCL 5 MG PO TABS: 5.0000 mg | ORAL_TABLET | Freq: Every day | ORAL | Status: AC

## 2014-06-16 MED ORDER — DONEPEZIL HCL 5 MG PO TABS: 5.0000 mg | ORAL_TABLET | Freq: Every day | ORAL | Status: DC

## 2014-06-16 NOTE — Progress Notes (Signed)
Patient ID: Christian [name], male   DOB: [birth year], 68 y.o.   MRN: [id number]    Allergies  Allergen Reactions  . Morphine And Related     Per pt: unknown    Chief Complaint  Patient presents with  . Follow-up    HPI: Patient is a 68 y.o. male seen in the office today for rotuine followup  Pt with hx of dementia, GERD, depression and ED Pts wife is here at the visit providing history with pt Apparently pt has previously been taking care of his medications however now wife reports she is unsure what is going on with medication, realized he has not been taking anything routinely  He used to do his pil box now wife needs to take over Acid reflux- Protonix helps this Anxiety and depression- was prescribed celexa but wife thinks he is not taking this.  Arthritis- has pain in multiple joints, not taking medications routinely for this  Memory loss- taking aricept and namenda (or this was supposed to per records unsure if he is actually taking this)  ED- viagra is helpful, can not afford Rx Review of Systems:  Review of Systems  Constitutional: Negative for fever, chills and weight loss.  HENT: Negative for ear pain, hearing loss, sore throat and tinnitus.   Eyes: Negative.   Respiratory: Negative for cough and shortness of breath.   Cardiovascular: Negative for chest pain and palpitations.  Gastrointestinal: Negative for heartburn, nausea, vomiting, abdominal pain, diarrhea and constipation.  Genitourinary: Negative for dysuria, urgency and frequency.  Musculoskeletal: Positive for joint pain (occasional). Negative for back pain and falls.  Skin: Negative for itching and rash.  Neurological: Negative for dizziness, tingling, weakness and headaches.  Psychiatric/Behavioral: Positive for memory loss ( no unsafe behaviors). Negative for depression. The patient is not nervous/anxious and does not have insomnia.        Memory loss stable     Past Medical History  Diagnosis Date  .  Alzheimer disease   . Diverticulitis   . Nodular prostate    Past Surgical History  Procedure Laterality Date  . Carpal tunnel with cubital tunnel  2012    Lamar Benes, MD  . Spine surgery  2005    Maia Breslow, MD  . Squamous cell carcinoma excision     Social History:   reports that he has been smoking Cigarettes.  He has a 25 pack-year smoking history. He has never used smokeless tobacco. He reports that he drinks about 2 - 3 ounces of alcohol per week. He reports that he does not use illicit drugs.  Family History  Problem Relation Age of Onset  . Cancer Mother     bone  . Cancer Father     lung  . Dementia Father     Medications: Patient's Medications  New Prescriptions   No medications on file  Previous Medications   CITALOPRAM (CELEXA) 40 MG TABLET    TAKE 1 TABLET (40 MG TOTAL) BY MOUTH DAILY.   DONEPEZIL (ARICEPT) 10 MG TABLET    Take 10 mg by mouth 2 (two) times daily.   FLUTICASONE (FLONASE) 50 MCG/ACT NASAL SPRAY    Place 2 sprays into the nose 2 (two) times daily.   MEMANTINE HCL ER 28 MG CP24    Take 28 mg by mouth daily.   NAPROXEN SODIUM (ANAPROX) 220 MG TABLET    Take 220 mg by mouth daily as needed.    PANTOPRAZOLE (PROTONIX) 40 MG TABLET  TAKE 1 TABLET BY MOUTH EVERY DAY   PNEUMOCOCCAL 23 VALENT VACCINE (PNU-IMMUNE) 25 MCG/0.5ML INJECTION    Inject 0.5 mLs into the muscle once.   SILDENAFIL (VIAGRA) 50 MG TABLET    Take 1 tablet (50 mg total) by mouth daily as needed for erectile dysfunction.  Modified Medications   No medications on file  Discontinued Medications   No medications on file     Physical Exam:  Filed Vitals:   06/16/14 0832  BP: 138/88  Pulse: 65  Temp: 98.2 F (36.8 C)  TempSrc: Oral  Resp: 18  Height: 5\' 8"  (1.727 m)  Weight: 139 lb (63.05 kg)  SpO2: 97%   Physical Exam  Constitutional: He is well-developed, well-nourished, and in no distress. No distress.  HENT:  Mouth/Throat: Oropharynx is clear and moist. No  oropharyngeal exudate.  Eyes: Conjunctivae and EOM are normal. Pupils are equal, round, and reactive to light.  Neck: Normal range of motion. Neck supple. No thyromegaly present.  Cardiovascular: Normal rate, regular rhythm and normal heart sounds.   Pulmonary/Chest: Effort normal and breath sounds normal.  Abdominal: Soft. Bowel sounds are normal. He exhibits no distension.  Musculoskeletal: Normal range of motion. He exhibits no edema and no tenderness.  Neurological: He is alert.  Skin: Skin is warm and dry. He is not diaphoretic.  Psychiatric: Affect normal.     Labs reviewed: Basic Metabolic Panel:  Recent Labs  07/13/13 1041  NA 142  K 4.4  CL 102  CO2 28  GLUCOSE 85  BUN 15  CREATININE 0.76  CALCIUM 9.2   Liver Function Tests:  Recent Labs  07/13/13 1041  AST 14  ALT 8  ALKPHOS 75  BILITOT 0.3  PROT 6.4   No results found for this basename: LIPASE, AMYLASE,  in the last 8760 hours No results found for this basename: AMMONIA,  in the last 8760 hours CBC:  Recent Labs  07/13/13 1041  WBC 6.6  NEUTROABS 4.1  HGB 13.8  HCT 40.7  MCV 90  PLT 229   Lipid Panel:  Recent Labs  07/13/13 1041  HDL 51  LDLCALC 117*  TRIG 93  CHOLHDL 3.7     Assessment/Plan  1. Gastroesophageal reflux disease with esophagitis Stable on medications and dietary modifications  - pantoprazole (PROTONIX) 40 MG tablet; Take 1 tablet (40 mg total) by mouth daily.  Dispense: 30 tablet; Refill: 3  2. Erectile dysfunction, unspecified erectile dysfunction type Viagra worked well in the past but can not afford -levitra samples given 20 mg to take 1/2 tablet and if this is not effective to take 1 tablet as needed 1 hour prior to intercourse   3. Alzheimer's dementia without behavioral disturbance -unsure if he is actually taking medication -namenda titration pack given to titrate up to 28 mg daily  - donepezil (ARICEPT) 5 MG tablet; Take 1 tablet (5 mg total) by mouth at  bedtime.  Dispense: 30 tablet; Refill: 0 -after 1 month of aricept 5 mg and titration up to namenda 28 mg start samples of namzaric 28-10 once daily for a month supply   4. Depression due to dementia -has been stable  5. Allergic rhinitis due to pollen -currently stable but will need flonase in the fall - fluticasone (FLONASE) 50 MCG/ACT nasal spray; Place 2 sprays into both nostrils 2 (two) times daily as needed for allergies or rhinitis.

## 2014-06-16 NOTE — Patient Instructions (Addendum)
after 1 month of aricept 5 mg and titration up to namenda 28 mg THEN-----start samples of namzaric 28-10 once daily for a month supply   Follow up in 6 months To get fasting labs as soon as you are able- please schedule with front desk

## 2014-06-20 ENCOUNTER — Other Ambulatory Visit: Payer: Self-pay | Admitting: *Deleted

## 2014-06-20 ENCOUNTER — Other Ambulatory Visit: Payer: Medicare Other

## 2014-06-20 DIAGNOSIS — N402 Nodular prostate without lower urinary tract symptoms: Secondary | ICD-10-CM | POA: Diagnosis not present

## 2014-06-20 DIAGNOSIS — K5732 Diverticulitis of large intestine without perforation or abscess without bleeding: Secondary | ICD-10-CM

## 2014-06-20 DIAGNOSIS — E785 Hyperlipidemia, unspecified: Secondary | ICD-10-CM

## 2014-06-21 LAB — CBC WITH DIFFERENTIAL/PLATELET
BASOS: 1 %
Basophils Absolute: 0 10*3/uL (ref 0.0–0.2)
Eos: 2 %
Eosinophils Absolute: 0.1 10*3/uL (ref 0.0–0.4)
HCT: 40 % (ref 37.5–51.0)
Hemoglobin: 13.5 g/dL (ref 12.6–17.7)
IMMATURE GRANULOCYTES: 0 %
Immature Grans (Abs): 0 10*3/uL (ref 0.0–0.1)
Lymphocytes Absolute: 1.8 10*3/uL (ref 0.7–3.1)
Lymphs: 28 %
MCH: 30.6 pg (ref 26.6–33.0)
MCHC: 33.8 g/dL (ref 31.5–35.7)
MCV: 91 fL (ref 79–97)
MONOS ABS: 0.4 10*3/uL (ref 0.1–0.9)
Monocytes: 7 %
NEUTROS PCT: 62 %
Neutrophils Absolute: 3.9 10*3/uL (ref 1.4–7.0)
RBC: 4.41 x10E6/uL (ref 4.14–5.80)
RDW: 14.6 % (ref 12.3–15.4)
WBC: 6.3 10*3/uL (ref 3.4–10.8)

## 2014-06-21 LAB — COMPREHENSIVE METABOLIC PANEL
ALBUMIN: 4.1 g/dL (ref 3.6–4.8)
ALK PHOS: 83 IU/L (ref 39–117)
ALT: 9 IU/L (ref 0–44)
AST: 13 IU/L (ref 0–40)
Albumin/Globulin Ratio: 2.1 (ref 1.1–2.5)
BILIRUBIN TOTAL: 0.2 mg/dL (ref 0.0–1.2)
BUN / CREAT RATIO: 15 (ref 10–22)
BUN: 12 mg/dL (ref 8–27)
CO2: 24 mmol/L (ref 18–29)
Calcium: 9.2 mg/dL (ref 8.6–10.2)
Chloride: 102 mmol/L (ref 97–108)
Creatinine, Ser: 0.8 mg/dL (ref 0.76–1.27)
GFR calc Af Amer: 105 mL/min/{1.73_m2} (ref >59)
GFR calc non Af Amer: 91 mL/min/{1.73_m2} (ref >59)
Globulin, Total: 2 g/dL (ref 1.5–4.5)
Glucose: 90 mg/dL (ref 65–99)
Potassium: 4.8 mmol/L (ref 3.5–5.2)
SODIUM: 140 mmol/L (ref 134–144)
Total Protein: 6.1 g/dL (ref 6.0–8.5)

## 2014-06-21 LAB — LIPID PANEL
CHOLESTEROL TOTAL: 188 mg/dL (ref 100–199)
Chol/HDL Ratio: 3.8 ratio units (ref 0.0–5.0)
HDL: 50 mg/dL (ref >39)
LDL CALC: 124 mg/dL — AB (ref 0–99)
TRIGLYCERIDES: 70 mg/dL (ref 0–149)
VLDL Cholesterol Cal: 14 mg/dL (ref 5–40)

## 2014-08-01 DIAGNOSIS — H2510 Age-related nuclear cataract, unspecified eye: Secondary | ICD-10-CM | POA: Diagnosis not present

## 2014-08-13 ENCOUNTER — Other Ambulatory Visit: Payer: Self-pay | Admitting: Nurse Practitioner

## 2014-08-15 ENCOUNTER — Telehealth: Payer: Self-pay | Admitting: *Deleted

## 2014-08-15 NOTE — Telephone Encounter (Signed)
Spoke with patient's wife regarding medication refill of aricept, she states that he does not need that refill has started the [place].

## 2014-09-19 ENCOUNTER — Other Ambulatory Visit: Payer: Self-pay | Admitting: *Deleted

## 2014-09-19 MED ORDER — MEMANTINE HCL-DONEPEZIL HCL ER 28-10 MG PO CP24: 1.0000 | ORAL_CAPSULE | Freq: Every day | ORAL | Status: DC

## 2014-09-19 NOTE — Telephone Encounter (Signed)
Patient wife stopped and wanted refill faxed to pharmacy

## 2014-10-29 ENCOUNTER — Encounter: Payer: Self-pay | Admitting: Nurse Practitioner

## 2014-10-31 ENCOUNTER — Telehealth: Payer: Self-pay | Admitting: *Deleted

## 2014-10-31 NOTE — Telephone Encounter (Signed)
Prior Authorization filled out for patient for Namzaric for [facility]. Given to Janett Billow to review and sign

## 2014-11-01 NOTE — Telephone Encounter (Signed)
Namzaric is Approved through 11/25/2015 through [facility]

## 2014-12-22 ENCOUNTER — Encounter: Payer: Self-pay | Admitting: Nurse Practitioner

## 2014-12-22 ENCOUNTER — Ambulatory Visit (INDEPENDENT_AMBULATORY_CARE_PROVIDER_SITE_OTHER): Payer: Medicare Other | Admitting: Nurse Practitioner

## 2014-12-22 VITALS — BP 120/80 | HR 55 | Temp 97.7°F | Resp 18 | Ht 68.0 in | Wt 140.0 lb

## 2014-12-22 DIAGNOSIS — E785 Hyperlipidemia, unspecified: Secondary | ICD-10-CM | POA: Diagnosis not present

## 2014-12-22 DIAGNOSIS — G309 Alzheimer's disease, unspecified: Secondary | ICD-10-CM | POA: Diagnosis not present

## 2014-12-22 DIAGNOSIS — Z23 Encounter for immunization: Secondary | ICD-10-CM | POA: Diagnosis not present

## 2014-12-22 DIAGNOSIS — K219 Gastro-esophageal reflux disease without esophagitis: Secondary | ICD-10-CM | POA: Diagnosis not present

## 2014-12-22 DIAGNOSIS — F0280 Dementia in other diseases classified elsewhere without behavioral disturbance: Secondary | ICD-10-CM

## 2014-12-22 NOTE — Addendum Note (Signed)
Addended by: Eilene Ghazi on: 12/22/2014 09:22 AM   Modules accepted: Orders

## 2014-12-22 NOTE — Patient Instructions (Signed)
To follow up in 6 months for EV with fasting blood work prior to visit   Cardiac Diet This diet can help prevent heart disease and stroke. Many factors influence your heart health, including eating and exercise habits. Coronary risk rises a lot with abnormal blood fat (lipid) levels. Cardiac meal planning includes limiting unhealthy fats, increasing healthy fats, and making other small dietary changes. General guidelines are as follows:  Adjust calorie intake to reach and maintain desirable body weight.  Limit total fat intake to less than 30% of total calories. Saturated fat should be less than 7% of calories.  Saturated fats are found in animal products and in some vegetable products. Saturated vegetable fats are found in coconut oil, cocoa butter, palm oil, and palm kernel oil. Read labels carefully to avoid these products as much as possible. Use butter in moderation. Choose tub margarines and oils that have 2 grams of fat or less. Good cooking oils are canola and olive oils.  Practice low-fat cooking techniques. Do not fry food. Instead, broil, bake, boil, steam, grill, roast on a rack, stir-fry, or microwave it. Other fat reducing suggestions include:  Remove the skin from poultry.  Remove all visible fat from meats.  Skim the fat off stews, soups, and gravies before serving them.  Steam vegetables in water or broth instead of sauting them in fat.  Avoid foods with trans fat (or hydrogenated oils), such as commercially fried foods and commercially baked goods. Commercial shortening and deep-frying fats will contain trans fat.  Increase intake of fruits, vegetables, whole grains, and legumes to replace foods high in fat.  Increase consumption of nuts, legumes, and seeds to at least 4 servings weekly. One serving of a legume equals  cup, and 1 serving of nuts or seeds equals  cup.  Choose whole grains more often. Have 3 servings per day (a serving is 1 ounce [oz]).  Eat 4 to 5  servings of vegetables per day. A serving of vegetables is 1 cup of raw leafy vegetables;  cup of raw or cooked cut-up vegetables;  cup of vegetable juice.  Eat 4 to 5 servings of fruit per day. A serving of fruit is 1 medium whole fruit;  cup of dried fruit;  cup of fresh, frozen, or canned fruit;  cup of 100% fruit juice.  Increase your intake of dietary fiber to 20 to 30 grams per day. Insoluble fiber may help lower your risk of heart disease and may help curb your appetite.  Soluble fiber binds cholesterol to be removed from the blood. Foods high in soluble fiber are dried beans, citrus fruits, oats, apples, bananas, broccoli, Brussels sprouts, and eggplant.  Try to include foods fortified with plant sterols or stanols, such as yogurt, breads, juices, or margarines. Choose several fortified foods to achieve a daily intake of 2 to 3 grams of plant sterols or stanols.  Foods with omega-3 fats can help reduce your risk of heart disease. Aim to have a 3.5 oz portion of fatty fish twice per week, such as salmon, mackerel, albacore tuna, sardines, lake trout, or herring. If you wish to take a fish oil supplement, choose one that contains 1 gram of both DHA and EPA.  Limit processed meats to 2 servings (3 oz portion) weekly.  Limit the sodium in your diet to 1500 milligrams (mg) per day. If you have high blood pressure, talk to a registered dietitian about a DASH (Dietary Approaches to Stop Hypertension) eating plan.  Limit sweets  and beverages with added sugar, such as soda, to no more than 5 servings per week. One serving is:   1 tablespoon sugar.  1 tablespoon jelly or jam.   cup sorbet.  1 cup lemonade.   cup regular soda. CHOOSING FOODS Starches  Allowed: Breads: All kinds (wheat, rye, raisin, white, oatmeal, [place], [place], and English muffin bread). Low-fat rolls: English muffins, frankfurter and hamburger buns, bagels, pita bread, tortillas (not fried). Pancakes, waffles,  biscuits, and muffins made with recommended oil.  Avoid: Products made with saturated or trans fats, oils, or whole milk products. Butter rolls, cheese breads, croissants. Commercial doughnuts, muffins, sweet rolls, biscuits, waffles, pancakes, store-bought mixes. Crackers  Allowed: Low-fat crackers and snacks: Animal, graham, rye, saltine (with recommended oil, no lard), oyster, and matzo crackers. Bread sticks, melba toast, rusks, flatbread, pretzels, and light popcorn.  Avoid: High-fat crackers: cheese crackers, butter crackers, and those made with coconut, palm oil, or trans fat (hydrogenated oils). Buttered popcorn. Cereals  Allowed: Hot or cold whole-grain cereals.  Avoid: Cereals containing coconut, hydrogenated vegetable fat, or animal fat. Potatoes / Pasta / Rice  Allowed: All kinds of potatoes, rice, and pasta (such as macaroni, spaghetti, and noodles).  Avoid: Pasta or rice prepared with cream sauce or high-fat cheese. Chow mein noodles, [place] fries. Vegetables  Allowed: All vegetables and vegetable juices.  Avoid: Fried vegetables. Vegetables in cream, butter, or high-fat cheese sauces. Limit coconut. Fruit in cream or custard. Protein  Allowed: Limit your intake of meat, seafood, and poultry to no more than 6 oz (cooked weight) per day. All lean, well-trimmed beef, veal, pork, and lamb. All chicken and [place] without skin. All fish and shellfish. Wild game: wild duck, rabbit, pheasant, and venison. Egg whites or low-cholesterol egg substitutes may be used as desired. Meatless dishes: recipes with dried beans, peas, lentils, and tofu (soybean curd). Seeds and nuts: all seeds and most nuts.  Avoid: Prime grade and other heavily marbled and fatty meats, such as short ribs, spare ribs, rib eye roast or steak, frankfurters, sausage, bacon, and high-fat luncheon meats, mutton. Caviar. Commercially fried fish. Domestic duck, goose, venison sausage. Organ meats: liver, gizzard,  heart, chitterlings, brains, kidney, sweetbreads. Dairy  Allowed: Low-fat cheeses: nonfat or low-fat cottage cheese (1% or 2% fat), cheeses made with part skim milk, such as mozzarella, farmers, string, or ricotta. (Cheeses should be labeled no more than 2 to 6 grams fat per oz.). Skim (or 1%) milk: liquid, powdered, or evaporated. Buttermilk made with low-fat milk. Drinks made with skim or low-fat milk or cocoa. Chocolate milk or cocoa made with skim or low-fat (1%) milk. Nonfat or low-fat yogurt.  Avoid: Whole milk cheeses, including colby, cheddar, [place], Monterey Jack, [place], [place], [place], American, Swiss, and blue. Creamed cottage cheese, cream cheese. Whole milk and whole milk products, including buttermilk or yogurt made from whole milk, drinks made from whole milk. Condensed milk, evaporated whole milk, and 2% milk. Soups and Combination Foods  Allowed: Low-fat low-sodium soups: broth, dehydrated soups, homemade broth, soups with the fat removed, homemade cream soups made with skim or low-fat milk. Low-fat spaghetti, lasagna, chili, and Spanish rice if low-fat ingredients and low-fat cooking techniques are used.  Avoid: Cream soups made with whole milk, cream, or high-fat cheese. All other soups. Desserts and Sweets  Allowed: Sherbet, fruit ices, gelatins, meringues, and angel food cake. Homemade desserts with recommended fats, oils, and milk products. Jam, jelly, honey, marmalade, sugars, and syrups. Pure sugar candy, such as gum  drops, hard candy, jelly beans, marshmallows, mints, and small amounts of dark chocolate.  Avoid: Commercially prepared cakes, pies, cookies, frosting, pudding, or mixes for these products. Desserts containing whole milk products, chocolate, coconut, lard, palm oil, or palm kernel oil. Ice cream or ice cream drinks. Candy that contains chocolate, coconut, butter, hydrogenated fat, or unknown ingredients. Buttered syrups. Fats and Oils  Allowed: Vegetable  oils: safflower, sunflower, corn, soybean, cottonseed, sesame, canola, olive, or peanut. Non-hydrogenated margarines. Salad dressing or mayonnaise: homemade or commercial, made with a recommended oil. Low or nonfat salad dressing or mayonnaise.  Limit added fats and oils to 6 to 8 tsp per day (includes fats used in cooking, baking, salads, and spreads on bread). Remember to count the "hidden fats" in foods.  Avoid: Solid fats and shortenings: butter, lard, salt pork, bacon drippings. Gravy containing meat fat, shortening, or suet. Cocoa butter, coconut. Coconut oil, palm oil, palm kernel oil, or hydrogenated oils: these ingredients are often used in bakery products, nondairy creamers, whipped toppings, candy, and commercially fried foods. Read labels carefully. Salad dressings made of unknown oils, sour cream, or cheese, such as blue cheese and Roquefort. Cream, all kinds: half-and-half, light, heavy, or whipping. Sour cream or cream cheese (even if "light" or low-fat). Nondairy cream substitutes: coffee creamers and sour cream substitutes made with palm, palm kernel, hydrogenated oils, or coconut oil. Beverages  Allowed: Coffee (regular or decaffeinated), tea. Diet carbonated beverages, mineral water. Alcohol: Check with your caregiver. Moderation is recommended.  Avoid: Whole milk, regular sodas, and juice drinks with added sugar. Condiments  Allowed: All seasonings and condiments. Cocoa powder. "Cream" sauces made with recommended ingredients.  Avoid: Carob powder made with hydrogenated fats. SAMPLE MENU Breakfast   cup orange juice   cup oatmeal  1 slice toast  1 tsp margarine  1 cup skim milk Lunch  [place] sandwich with 2 oz [place], 2 slices bread  Lettuce and tomato slices  Fresh fruit  Carrot sticks  Coffee or tea Snack  Fresh fruit or low-fat crackers Dinner  3 oz lean ground beef  1 baked potato  1 tsp margarine   cup asparagus  Lettuce salad  1 tbs  non-creamy dressing   cup peach slices  1 cup skim milk Document Released: 08/20/2008 Document Revised: 05/12/2012 Document Reviewed: 01/11/2014 [employer] Patient Information 2015 [place], [place]. This information is not intended to replace advice given to you by your health care provider. Make sure you discuss any questions you have with your health care provider.

## 2014-12-22 NOTE — Progress Notes (Signed)
Patient ID: Christian [name], male   DOB: 21-Jul-1946, 69 y.o.   MRN: [id number]    Allergies  Allergen Reactions  . Morphine And Related     Per pt: unknown    Chief Complaint  Patient presents with  . Medical Management of Chronic Issues    HPI: Patient is a 69 y.o. male seen in the office today for rotuine followup  Pt with hx of dementia, GERD, depression, OA and ED Pts wife is here at the visit providing history with pt Tolerating namzeric with good effects. Reports aphagia is better.  Has been more social, not as anxious.    Taking Protonix for acid reflux, occasional GERD. Overall eating good with cues from his wife. Will not eat without being told to or having food in front of him.  Still bathing himself and dressing himself.  Needs help picking out clothes.    Has cut back on smoking  Review of Systems  Constitutional: Negative for fever, chills and weight loss.  HENT: Negative for tinnitus.   Respiratory: Negative for cough, sputum production and shortness of breath.   Cardiovascular: Negative for chest pain, palpitations and leg swelling.  Gastrointestinal: Positive for heartburn. Negative for abdominal pain, diarrhea and constipation.  Genitourinary: Negative for dysuria, urgency and frequency.  Musculoskeletal: Negative for myalgias, back pain, joint pain and falls.  Skin: Negative.   Neurological: Negative for dizziness and headaches.  Psychiatric/Behavioral: Positive for memory loss. Negative for depression. The patient does not have insomnia.      Past Medical History  Diagnosis Date  . Alzheimer disease   . Diverticulitis   . Nodular prostate    Past Surgical History  Procedure Laterality Date  . Carpal tunnel with cubital tunnel  2012    Lamar Benes, MD  . Spine surgery  2005    Maia Breslow, MD  . Squamous cell carcinoma excision     Social History:   reports that he has been smoking Cigarettes.  He has a 25 pack-year smoking history. He  has never used smokeless tobacco. He reports that he drinks about 2.0 - 3.0 oz of alcohol per week. He reports that he does not use illicit drugs.  Family History  Problem Relation Age of Onset  . Cancer Mother     bone  . Cancer Father     lung  . Dementia Father     Medications: Patient's Medications  New Prescriptions   No medications on file  Previous Medications   FLUTICASONE (FLONASE) 50 MCG/ACT NASAL SPRAY    Place 2 sprays into both nostrils 2 (two) times daily as needed for allergies or rhinitis.   MEMANTINE HCL-DONEPEZIL HCL (NAMZARIC) 28-10 MG CP24    Take 1 tablet by mouth daily. To preserve memory   PANTOPRAZOLE (PROTONIX) 40 MG TABLET    Take 1 tablet (40 mg total) by mouth daily.  Modified Medications   No medications on file  Discontinued Medications   SILDENAFIL (VIAGRA) 50 MG TABLET    Take 1 tablet (50 mg total) by mouth daily as needed for erectile dysfunction.     Physical Exam:  Filed Vitals:   12/22/14 0830  BP: 120/80  Pulse: 55  Temp: 97.7 F (36.5 C)  TempSrc: Oral  Resp: 18  Height: 5\' 8"  (1.727 m)  Weight: 140 lb (63.504 kg)  SpO2: 96%   Physical Exam  Constitutional: He is well-developed, well-nourished, and in no distress. No distress.  HENT:  Mouth/Throat: Oropharynx  is clear and moist. No oropharyngeal exudate.  Eyes: Conjunctivae and EOM are normal. Pupils are equal, round, and reactive to light.  Cardiovascular: Normal rate, regular rhythm and normal heart sounds.   Pulmonary/Chest: Effort normal and breath sounds normal.  Abdominal: Soft. Bowel sounds are normal. He exhibits no distension.  Musculoskeletal: Normal range of motion. He exhibits no edema or tenderness.  Neurological: He is alert.  Skin: Skin is warm and dry. He is not diaphoretic.  Psychiatric: Affect normal.     Labs reviewed: Basic Metabolic Panel:  Recent Labs  06/20/14 0854  NA 140  K 4.8  CL 102  CO2 24  GLUCOSE 90  BUN 12  CREATININE 0.80    CALCIUM 9.2   Liver Function Tests:  Recent Labs  06/20/14 0854  AST 13  ALT 9  ALKPHOS 83  BILITOT 0.2  PROT 6.1   No results for input(s): LIPASE, AMYLASE in the last 8760 hours. No results for input(s): AMMONIA in the last 8760 hours. CBC:  Recent Labs  06/20/14 0854  WBC 6.3  NEUTROABS 3.9  HGB 13.5  HCT 40.0  MCV 91   Lipid Panel:  Recent Labs  06/20/14 0854  HDL 50  LDLCALC 124*  TRIG 70  CHOLHDL 3.8     Assessment/Plan  1. Gastroesophageal reflux disease without esophagitis -conts on Protonix, aware of diet modifications.    2. Alzheimer disease -conts on namzaric, overall dementia has remained stable over the last year and wife reports he did better this year at christmas vs last.  - CBC with Differential/Platelet; Future  3. Hyperlipidemia -LDL was elevated last year, did not want medication at that time, will monitor. Discussed dietary modifications.  - Comprehensive metabolic panel; Future - Lipid panel; Future  Discussed screening with wife and pt they do not opt for further screening at this point due to pts dementia.

## 2015-06-21 ENCOUNTER — Other Ambulatory Visit: Payer: Medicare Other

## 2015-06-29 ENCOUNTER — Encounter: Payer: Self-pay | Admitting: Nurse Practitioner

## 2015-06-29 ENCOUNTER — Encounter (HOSPITAL_COMMUNITY): Payer: Self-pay | Admitting: Emergency Medicine

## 2015-06-29 ENCOUNTER — Emergency Department (HOSPITAL_COMMUNITY)
Admission: EM | Admit: 2015-06-29 | Discharge: 2015-06-30 | Disposition: A | Discharge status: Home / Self Care | Payer: Medicare Other | Attending: Emergency Medicine | Admitting: Emergency Medicine

## 2015-06-29 ENCOUNTER — Emergency Department (HOSPITAL_COMMUNITY): Payer: Medicare Other

## 2015-06-29 ENCOUNTER — Ambulatory Visit (INDEPENDENT_AMBULATORY_CARE_PROVIDER_SITE_OTHER): Payer: Medicare Other | Admitting: Nurse Practitioner

## 2015-06-29 VITALS — BP 128/82 | HR 51 | Temp 98.1°F | Resp 20 | Ht 68.0 in | Wt 130.6 lb

## 2015-06-29 DIAGNOSIS — Z0000 Encounter for general adult medical examination without abnormal findings: Secondary | ICD-10-CM | POA: Diagnosis not present

## 2015-06-29 DIAGNOSIS — F0390 Unspecified dementia without behavioral disturbance: Secondary | ICD-10-CM

## 2015-06-29 DIAGNOSIS — F0391 Unspecified dementia with behavioral disturbance: Secondary | ICD-10-CM | POA: Diagnosis not present

## 2015-06-29 DIAGNOSIS — F1310 Sedative, hypnotic or anxiolytic abuse, uncomplicated: Secondary | ICD-10-CM | POA: Diagnosis not present

## 2015-06-29 DIAGNOSIS — Z720 Tobacco use: Secondary | ICD-10-CM | POA: Diagnosis not present

## 2015-06-29 DIAGNOSIS — K219 Gastro-esophageal reflux disease without esophagitis: Secondary | ICD-10-CM | POA: Diagnosis not present

## 2015-06-29 DIAGNOSIS — S199XXA Unspecified injury of neck, initial encounter: Secondary | ICD-10-CM | POA: Diagnosis not present

## 2015-06-29 DIAGNOSIS — Z8719 Personal history of other diseases of the digestive system: Secondary | ICD-10-CM | POA: Insufficient documentation

## 2015-06-29 DIAGNOSIS — F29 Unspecified psychosis not due to a substance or known physiological condition: Secondary | ICD-10-CM | POA: Diagnosis not present

## 2015-06-29 DIAGNOSIS — Z87438 Personal history of other diseases of male genital organs: Secondary | ICD-10-CM | POA: Insufficient documentation

## 2015-06-29 DIAGNOSIS — G309 Alzheimer's disease, unspecified: Secondary | ICD-10-CM | POA: Diagnosis not present

## 2015-06-29 DIAGNOSIS — E785 Hyperlipidemia, unspecified: Secondary | ICD-10-CM | POA: Diagnosis not present

## 2015-06-29 DIAGNOSIS — F0280 Dementia in other diseases classified elsewhere without behavioral disturbance: Secondary | ICD-10-CM

## 2015-06-29 DIAGNOSIS — Z79899 Other long term (current) drug therapy: Secondary | ICD-10-CM | POA: Insufficient documentation

## 2015-06-29 DIAGNOSIS — Z7951 Long term (current) use of inhaled steroids: Secondary | ICD-10-CM | POA: Insufficient documentation

## 2015-06-29 DIAGNOSIS — F10120 Alcohol abuse with intoxication, uncomplicated: Secondary | ICD-10-CM | POA: Diagnosis not present

## 2015-06-29 DIAGNOSIS — F411 Generalized anxiety disorder: Secondary | ICD-10-CM

## 2015-06-29 DIAGNOSIS — S0990XA Unspecified injury of head, initial encounter: Secondary | ICD-10-CM | POA: Diagnosis not present

## 2015-06-29 DIAGNOSIS — R634 Abnormal weight loss: Secondary | ICD-10-CM | POA: Diagnosis not present

## 2015-06-29 DIAGNOSIS — F10920 Alcohol use, unspecified with intoxication, uncomplicated: Secondary | ICD-10-CM

## 2015-06-29 DIAGNOSIS — F10129 Alcohol abuse with intoxication, unspecified: Secondary | ICD-10-CM | POA: Diagnosis present

## 2015-06-29 MED ORDER — TETANUS-DIPHTH-ACELL PERTUSSIS 5-2.5-18.5 LF-MCG/0.5 IM SUSP: 0.5000 mL | Freq: Once | INTRAMUSCULAR | Status: DC

## 2015-06-29 MED ORDER — LORAZEPAM 2 MG/ML IJ SOLN
1.0000 mg | Freq: Once | INTRAMUSCULAR | Status: AC
  Administered 2015-06-30: 1 mg via INTRAVENOUS
  Filled 2015-06-29: qty 1

## 2015-06-29 MED ORDER — MIRTAZAPINE 15 MG PO TABS: 15.0000 mg | ORAL_TABLET | Freq: Every day | ORAL | Status: DC

## 2015-06-29 MED ORDER — SODIUM CHLORIDE 0.9 % IV BOLUS (SEPSIS)
1000.0000 mL | Freq: Once | INTRAVENOUS | Status: AC
  Administered 2015-06-29: 1000 mL via INTRAVENOUS

## 2015-06-29 MED ORDER — CITALOPRAM HYDROBROMIDE 20 MG PO TABS: 20.0000 mg | ORAL_TABLET | Freq: Every day | ORAL | Status: DC

## 2015-06-29 NOTE — ED Notes (Signed)
Pt becoming more combative, hitting sides of stretcher, not following commands. PA-C made aware of same.

## 2015-06-29 NOTE — ED Notes (Signed)
Pt presents from home via EMS for ETOH, combative, dementia. Pinpoint pupils.   Last VS 126/82, 60hr, cbg 108, 98% non rebreather. 5mg  versed IM in route.

## 2015-06-29 NOTE — ED Notes (Signed)
Bed: [facility] Expected date:  Expected time:  Means of arrival:  Comments: EMS 69 yo male from home/combative and agitated-hx dementia/intoxicated

## 2015-06-29 NOTE — Patient Instructions (Signed)
Cont ensures and liberalize diet Start remeron 15 mg at night for appetite stimulate  Cont celexa 20 mg daily for anxiety  Follow up in 6 weeks due to weight loss and follow up Remeron start   Preventive Care for Adults A healthy lifestyle and preventive care can promote health and wellness. Preventive health guidelines for men include the following key practices:  A routine yearly physical is a good way to check with your health care provider about your health and preventative screening. It is a chance to share any concerns and updates on your health and to receive a thorough exam.  Visit your dentist for a routine exam and preventative care every 6 months. Brush your teeth twice a day and floss once a day. Good oral hygiene prevents tooth decay and gum disease.  The frequency of eye exams is based on your age, health, family medical history, use of contact lenses, and other factors. Follow your health care provider's recommendations for frequency of eye exams.  Eat a healthy diet. Foods such as vegetables, fruits, whole grains, low-fat dairy products, and lean protein foods contain the nutrients you need without too many calories. Decrease your intake of foods high in solid fats, added sugars, and salt. Eat the right amount of calories for you.Get information about a proper diet from your health care provider, if necessary.  Regular physical exercise is one of the most important things you can do for your health. Most adults should get at least 150 minutes of moderate-intensity exercise (any activity that increases your heart rate and causes you to sweat) each week. In addition, most adults need muscle-strengthening exercises on 2 or more days a week.  Maintain a healthy weight. The body mass index (BMI) is a screening tool to identify possible weight problems. It provides an estimate of body fat based on height and weight. Your health care provider can find your BMI and can help you achieve  or maintain a healthy weight.For adults 20 years and older:  A BMI below 18.5 is considered underweight.  A BMI of 18.5 to 24.9 is normal.  A BMI of 25 to 29.9 is considered overweight.  A BMI of 30 and above is considered obese.  Maintain normal blood lipids and cholesterol levels by exercising and minimizing your intake of saturated fat. Eat a balanced diet with plenty of fruit and vegetables. Blood tests for lipids and cholesterol should begin at age 45 and be repeated every 5 years. If your lipid or cholesterol levels are high, you are over 50, or you are at high risk for heart disease, you may need your cholesterol levels checked more frequently.Ongoing high lipid and cholesterol levels should be treated with medicines if diet and exercise are not working.  If you smoke, find out from your health care provider how to quit. If you do not use tobacco, do not start.  Lung cancer screening is recommended for adults aged 4-80 years who are at high risk for developing lung cancer because of a history of smoking. A yearly low-dose CT scan of the lungs is recommended for people who have at least a 30-pack-year history of smoking and are a current smoker or have quit within the past 15 years. A pack year of smoking is smoking an average of 1 pack of cigarettes a day for 1 year (for example: 1 pack a day for 30 years or 2 packs a day for 15 years). Yearly screening should continue until the smoker has stopped  smoking for at least 15 years. Yearly screening should be stopped for people who develop a health problem that would prevent them from having lung cancer treatment.  If you choose to drink alcohol, do not have more than 2 drinks per day. One drink is considered to be 12 ounces (355 mL) of beer, 5 ounces (148 mL) of wine, or 1.5 ounces (44 mL) of liquor.  Avoid use of street drugs. Do not share needles with anyone. Ask for help if you need support or instructions about stopping the use of  drugs.  High blood pressure causes heart disease and increases the risk of stroke. Your blood pressure should be checked at least every 1-2 years. Ongoing high blood pressure should be treated with medicines, if weight loss and exercise are not effective.  If you are 60-43 years old, ask your health care provider if you should take aspirin to prevent heart disease.  Diabetes screening involves taking a blood sample to check your fasting blood sugar level. This should be done once every 3 years, after age 77, if you are within normal weight and without risk factors for diabetes. Testing should be considered at a younger age or be carried out more frequently if you are overweight and have at least 1 risk factor for diabetes.  Colorectal cancer can be detected and often prevented. Most routine colorectal cancer screening begins at the age of 61 and continues through age 66. However, your health care provider may recommend screening at an earlier age if you have risk factors for colon cancer. On a yearly basis, your health care provider may provide home test kits to check for hidden blood in the stool. Use of a small camera at the end of a tube to directly examine the colon (sigmoidoscopy or colonoscopy) can detect the earliest forms of colorectal cancer. Talk to your health care provider about this at age 25, when routine screening begins. Direct exam of the colon should be repeated every 5-10 years through age 4, unless early forms of precancerous polyps or small growths are found.  People who are at an increased risk for hepatitis B should be screened for this virus. You are considered at high risk for hepatitis B if:  You were born in a country where hepatitis B occurs often. Talk with your health care provider about which countries are considered high risk.  Your parents were born in a high-risk country and you have not received a shot to protect against hepatitis B (hepatitis B vaccine).  You have  HIV or AIDS.  You use needles to inject street drugs.  You live with, or have sex with, someone who has hepatitis B.  You are a man who has sex with other men (MSM).  You get hemodialysis treatment.  You take certain medicines for conditions such as cancer, organ transplantation, and autoimmune conditions.  Hepatitis C blood testing is recommended for all people born from [birth year] through 1965 and any individual with known risks for hepatitis C.  Practice safe sex. Use condoms and avoid high-risk sexual practices to reduce the spread of sexually transmitted infections (STIs). STIs include gonorrhea, chlamydia, syphilis, trichomonas, herpes, HPV, and human immunodeficiency virus (HIV). Herpes, HIV, and HPV are viral illnesses that have no cure. They can result in disability, cancer, and death.  If you are at risk of being infected with HIV, it is recommended that you take a prescription medicine daily to prevent HIV infection. This is called preexposure prophylaxis (PrEP). You  are considered at risk if:  You are a man who has sex with other men (MSM) and have other risk factors.  You are a heterosexual man, are sexually active, and are at increased risk for HIV infection.  You take drugs by injection.  You are sexually active with a partner who has HIV.  Talk with your health care provider about whether you are at high risk of being infected with HIV. If you choose to begin PrEP, you should first be tested for HIV. You should then be tested every 3 months for as long as you are taking PrEP.  A one-time screening for abdominal aortic aneurysm (AAA) and surgical repair of large AAAs by ultrasound are recommended for men ages 83 to 44 years who are current or former smokers.  Healthy men should no longer receive prostate-specific antigen (PSA) blood tests as part of routine cancer screening. Talk with your health care provider about prostate cancer screening.  Testicular cancer screening is  not recommended for adult males who have no symptoms. Screening includes self-exam, a health care provider exam, and other screening tests. Consult with your health care provider about any symptoms you have or any concerns you have about testicular cancer.  Use sunscreen. Apply sunscreen liberally and repeatedly throughout the day. You should seek shade when your shadow is shorter than you. Protect yourself by wearing long sleeves, pants, a wide-brimmed hat, and sunglasses year round, whenever you are outdoors.  Once a month, do a whole-body skin exam, using a mirror to look at the skin on your back. Tell your health care provider about new moles, moles that have irregular borders, moles that are larger than a pencil eraser, or moles that have changed in shape or color.  Stay current with required vaccines (immunizations).  Influenza vaccine. All adults should be immunized every year.  Tetanus, diphtheria, and acellular pertussis (Td, Tdap) vaccine. An adult who has not previously received Tdap or who does not know his vaccine status should receive 1 dose of Tdap. This initial dose should be followed by tetanus and diphtheria toxoids (Td) booster doses every 10 years. Adults with an unknown or incomplete history of completing a 3-dose immunization series with Td-containing vaccines should begin or complete a primary immunization series including a Tdap dose. Adults should receive a Td booster every 10 years.  Varicella vaccine. An adult without evidence of immunity to varicella should receive 2 doses or a second dose if he has previously received 1 dose.  Human papillomavirus (HPV) vaccine. Males aged 65-21 years who have not received the vaccine previously should receive the 3-dose series. Males aged 22-26 years may be immunized. Immunization is recommended through the age of 47 years for any male who has sex with males and did not get any or all doses earlier. Immunization is recommended for any  person with an immunocompromised condition through the age of 63 years if he did not get any or all doses earlier. During the 3-dose series, the second dose should be obtained 4-8 weeks after the first dose. The third dose should be obtained 24 weeks after the first dose and 16 weeks after the second dose.  Zoster vaccine. One dose is recommended for adults aged 88 years or older unless certain conditions are present.  Measles, mumps, and rubella (MMR) vaccine. Adults born before [birth year] generally are considered immune to measles and mumps. Adults born in [birth year] or later should have 1 or more doses of MMR vaccine unless there is  a contraindication to the vaccine or there is laboratory evidence of immunity to each of the three diseases. A routine second dose of MMR vaccine should be obtained at least 28 days after the first dose for students attending postsecondary schools, health care workers, or international travelers. People who received inactivated measles vaccine or an unknown type of measles vaccine during 1963-1967 should receive 2 doses of MMR vaccine. People who received inactivated mumps vaccine or an unknown type of mumps vaccine before 1979 and are at high risk for mumps infection should consider immunization with 2 doses of MMR vaccine. Unvaccinated health care workers born before [birth year] who lack laboratory evidence of measles, mumps, or rubella immunity or laboratory confirmation of disease should consider measles and mumps immunization with 2 doses of MMR vaccine or rubella immunization with 1 dose of MMR vaccine.  Pneumococcal 13-valent conjugate (PCV13) vaccine. When indicated, a person who is uncertain of his immunization history and has no record of immunization should receive the PCV13 vaccine. An adult aged 40 years or older who has certain medical conditions and has not been previously immunized should receive 1 dose of PCV13 vaccine. This PCV13 should be followed with a dose of pneumococcal  polysaccharide (PPSV23) vaccine. The PPSV23 vaccine dose should be obtained at least 8 weeks after the dose of PCV13 vaccine. An adult aged 94 years or older who has certain medical conditions and previously received 1 or more doses of PPSV23 vaccine should receive 1 dose of PCV13. The PCV13 vaccine dose should be obtained 1 or more years after the last PPSV23 vaccine dose.  Pneumococcal polysaccharide (PPSV23) vaccine. When PCV13 is also indicated, PCV13 should be obtained first. All adults aged 75 years and older should be immunized. An adult younger than age 5 years who has certain medical conditions should be immunized. Any person who resides in a nursing home or long-term care facility should be immunized. An adult smoker should be immunized. People with an immunocompromised condition and certain other conditions should receive both PCV13 and PPSV23 vaccines. People with human immunodeficiency virus (HIV) infection should be immunized as soon as possible after diagnosis. Immunization during chemotherapy or radiation therapy should be avoided. Routine use of PPSV23 vaccine is not recommended for American Indians, [place] Natives, or people younger than 65 years unless there are medical conditions that require PPSV23 vaccine. When indicated, people who have unknown immunization and have no record of immunization should receive PPSV23 vaccine. One-time revaccination 5 years after the first dose of PPSV23 is recommended for people aged 19-64 years who have chronic kidney failure, nephrotic syndrome, asplenia, or immunocompromised conditions. People who received 1-2 doses of PPSV23 before age 55 years should receive another dose of PPSV23 vaccine at age 57 years or later if at least 5 years have passed since the previous dose. Doses of PPSV23 are not needed for people immunized with PPSV23 at or after age 11 years.  Meningococcal vaccine. Adults with asplenia or persistent complement component deficiencies  should receive 2 doses of quadrivalent meningococcal conjugate (MenACWY-D) vaccine. The doses should be obtained at least 2 months apart. Microbiologists working with certain meningococcal bacteria, [place] recruits, people at risk during an outbreak, and people who travel to or live in countries with a high rate of meningitis should be immunized. A first-year college student up through age 60 years who is living in a residence hall should receive a dose if he did not receive a dose on or after his 16th birthday. Adults who have  certain high-risk conditions should receive one or more doses of vaccine.  Hepatitis A vaccine. Adults who wish to be protected from this disease, have certain high-risk conditions, work with hepatitis A-infected animals, work in hepatitis A research labs, or travel to or work in countries with a high rate of hepatitis A should be immunized. Adults who were previously unvaccinated and who anticipate close contact with an international adoptee during the first 60 days after arrival in the [place] from a country with a high rate of hepatitis A should be immunized.  Hepatitis B vaccine. Adults should be immunized if they wish to be protected from this disease, have certain high-risk conditions, may be exposed to blood or other infectious body fluids, are household contacts or sex partners of hepatitis B positive people, are clients or workers in certain care facilities, or travel to or work in countries with a high rate of hepatitis B.  Haemophilus influenzae type b (Hib) vaccine. A previously unvaccinated person with asplenia or sickle cell disease or having a scheduled splenectomy should receive 1 dose of Hib vaccine. Regardless of previous immunization, a recipient of a hematopoietic stem cell transplant should receive a 3-dose series 6-12 months after his successful transplant. Hib vaccine is not recommended for adults with HIV infection. Preventive Service / Frequency Ages  75 to 55  Blood pressure check.** / Every 1 to 2 years.  Lipid and cholesterol check.** / Every 5 years beginning at age 70.  Hepatitis C blood test.** / For any individual with known risks for hepatitis C.  Skin self-exam. / Monthly.  Influenza vaccine. / Every year.  Tetanus, diphtheria, and acellular pertussis (Tdap, Td) vaccine.** / Consult your health care provider. 1 dose of Td every 10 years.  Varicella vaccine.** / Consult your health care provider.  HPV vaccine. / 3 doses over 6 months, if 31 or younger.  Measles, mumps, rubella (MMR) vaccine.** / You need at least 1 dose of MMR if you were born in 1957 or later. You may also need a second dose.  Pneumococcal 13-valent conjugate (PCV13) vaccine.** / Consult your health care provider.  Pneumococcal polysaccharide (PPSV23) vaccine.** / 1 to 2 doses if you smoke cigarettes or if you have certain conditions.  Meningococcal vaccine.** / 1 dose if you are age 109 to 58 years and a Market researcher living in a residence hall, or have one of several medical conditions. You may also need additional booster doses.  Hepatitis A vaccine.** / Consult your health care provider.  Hepatitis B vaccine.** / Consult your health care provider.  Haemophilus influenzae type b (Hib) vaccine.** / Consult your health care provider. Ages 76 to 46  Blood pressure check.** / Every 1 to 2 years.  Lipid and cholesterol check.** / Every 5 years beginning at age 46.  Lung cancer screening. / Every year if you are aged 25-80 years and have a 30-pack-year history of smoking and currently smoke or have quit within the past 15 years. Yearly screening is stopped once you have quit smoking for at least 15 years or develop a health problem that would prevent you from having lung cancer treatment.  Fecal occult blood test (FOBT) of stool. / Every year beginning at age 80 and continuing until age 50. You may not have to do this test if you get a  colonoscopy every 10 years.  Flexible sigmoidoscopy** or colonoscopy.** / Every 5 years for a flexible sigmoidoscopy or every 10 years for a colonoscopy beginning at  age 56 and continuing until age 62.  Hepatitis C blood test.** / For all people born from [birth year] through 1965 and any individual with known risks for hepatitis C.  Skin self-exam. / Monthly.  Influenza vaccine. / Every year.  Tetanus, diphtheria, and acellular pertussis (Tdap/Td) vaccine.** / Consult your health care provider. 1 dose of Td every 10 years.  Varicella vaccine.** / Consult your health care provider.  Zoster vaccine.** / 1 dose for adults aged 34 years or older.  Measles, mumps, rubella (MMR) vaccine.** / You need at least 1 dose of MMR if you were born in 1958 or later. You may also need a second dose.  Pneumococcal 13-valent conjugate (PCV13) vaccine.** / Consult your health care provider.  Pneumococcal polysaccharide (PPSV23) vaccine.** / 1 to 2 doses if you smoke cigarettes or if you have certain conditions.  Meningococcal vaccine.** / Consult your health care provider.  Hepatitis A vaccine.** / Consult your health care provider.  Hepatitis B vaccine.** / Consult your health care provider.  Haemophilus influenzae type b (Hib) vaccine.** / Consult your health care provider. Ages 52 and over  Blood pressure check.** / Every 1 to 2 years.  Lipid and cholesterol check.**/ Every 5 years beginning at age 47.  Lung cancer screening. / Every year if you are aged 72-80 years and have a 30-pack-year history of smoking and currently smoke or have quit within the past 15 years. Yearly screening is stopped once you have quit smoking for at least 15 years or develop a health problem that would prevent you from having lung cancer treatment.  Fecal occult blood test (FOBT) of stool. / Every year beginning at age 24 and continuing until age 35. You may not have to do this test if you get a colonoscopy every 10  years.  Flexible sigmoidoscopy** or colonoscopy.** / Every 5 years for a flexible sigmoidoscopy or every 10 years for a colonoscopy beginning at age 29 and continuing until age 50.  Hepatitis C blood test.** / For all people born from [birth year] through 1966 and any individual with known risks for hepatitis C.  Abdominal aortic aneurysm (AAA) screening.** / A one-time screening for ages 83 to 13 years who are current or former smokers.  Skin self-exam. / Monthly.  Influenza vaccine. / Every year.  Tetanus, diphtheria, and acellular pertussis (Tdap/Td) vaccine.** / 1 dose of Td every 10 years.  Varicella vaccine.** / Consult your health care provider.  Zoster vaccine.** / 1 dose for adults aged 73 years or older.  Pneumococcal 13-valent conjugate (PCV13) vaccine.** / Consult your health care provider.  Pneumococcal polysaccharide (PPSV23) vaccine.** / 1 dose for all adults aged 93 years and older.  Meningococcal vaccine.** / Consult your health care provider.  Hepatitis A vaccine.** / Consult your health care provider.  Hepatitis B vaccine.** / Consult your health care provider.  Haemophilus influenzae type b (Hib) vaccine.** / Consult your health care provider. **Family history and personal history of risk and conditions may change your health care provider's recommendations. Document Released: 01/07/2002 Document Revised: 11/16/2013 Document Reviewed: 04/08/2011 [facility] Patient Information 2015 [place], [place]. This information is not intended to replace advice given to you by your health care provider. Make sure you discuss any questions you have with your health care provider.

## 2015-06-29 NOTE — Progress Notes (Signed)
Did not pass clock

## 2015-06-29 NOTE — Progress Notes (Signed)
Patient ID: TYRIC RODEHEAVER, male   DOB: 1946-04-04, 69 y.o.   MRN: [id number]    PCP: Lauree Chandler, NP  Allergies  Allergen Reactions  . Morphine And Related     Per pt: unknown    Chief Complaint  Patient presents with  . Annual Exam    Annual exam  . Medical Management of Chronic Issues     HPI: Patient is a 69 y.o. male seen in the office today for wellness exam.  Screenings: Colon Cancer- colonoscopy last 2014, due 2019 Prostate Cancer- declines exam  Depression screening Depression screen [facility] 2/9 12/22/2014 07/13/2013  Decreased Interest 0 0  Down, Depressed, Hopeless 0 0  PHQ - 2 Score 0 0   Falls Fall Risk  06/29/2015 12/22/2014 07/13/2013  Falls in the past year? No No No   MMSE MMSE - Mini Mental State Exam 06/29/2015 07/13/2013  Orientation to time 0 2  Orientation to Place 4 3  Registration 3 3  Attention/ Calculation 0 0  Recall 1 0  Language- name 2 objects 2 2  Language- repeat 1 1  Language- follow 3 step command 3 3  Language- read & follow direction 1 1  Write a sentence 0 1  Copy design 1 1  Total score 16 17     Vaccines Up to date on:  Influenza, both pneumococcal  Need:  Tdap- will need RX  Smoking status:.smoking, has cut back Alcohol use: 2 beers a day  Dentist: every 6 months  Ophthalmologist: every year  Exercise regimen: none, sometimes walk Diet: liberalized due to weight loss- has lost 10 lbs since January, decrease appetite.   Functional Status of ADLs: independent of all ADLs but has to have reminders   Hx of anxiety has been on Celexa in the past, wife restarted medication at 30 mg daily, need new rx. No side effects from medication  Not having acid reflux, stopped protonix    Advanced Directive information Does patient have an advance directive?: Yes Review of Systems:  Review of Systems  Constitutional: Positive for unexpected weight change (weight loss). Negative for fever and chills.  HENT: Negative for  tinnitus.   Respiratory: Negative for cough and shortness of breath.   Cardiovascular: Negative for chest pain, palpitations and leg swelling.  Gastrointestinal: Negative for abdominal pain, diarrhea and constipation.  Genitourinary: Negative for dysuria, urgency and frequency.  Musculoskeletal: Negative for myalgias and back pain.  Skin: Negative.   Neurological: Negative for dizziness and headaches.  Psychiatric/Behavioral:       Memory loss    Past Medical History  Diagnosis Date  . Alzheimer disease   . Diverticulitis   . Nodular prostate    Past Surgical History  Procedure Laterality Date  . Carpal tunnel with cubital tunnel  2012    Lamar Benes, MD  . Spine surgery  2005    Maia Breslow, MD  . Squamous cell carcinoma excision     Social History:   reports that he has been smoking Cigarettes.  He has a 25 pack-year smoking history. He has never used smokeless tobacco. He reports that he drinks about 2.0 - 3.0 oz of alcohol per week. He reports that he does not use illicit drugs.  Family History  Problem Relation Age of Onset  . Cancer Mother     bone  . Cancer Father     lung  . Dementia Father     Medications: Patient's Medications  New Prescriptions  No medications on file  Previous Medications   CITALOPRAM (CELEXA) 20 MG TABLET    Take 30 mg by mouth daily. To take 1.5 tablet daily for anxiety   FLUTICASONE (FLONASE) 50 MCG/ACT NASAL SPRAY    Place 2 sprays into both nostrils 2 (two) times daily as needed for allergies or rhinitis.   MEMANTINE HCL-DONEPEZIL HCL (NAMZARIC) 28-10 MG CP24    Take 1 tablet by mouth daily. To preserve memory   PANTOPRAZOLE (PROTONIX) 40 MG TABLET    Take 1 tablet (40 mg total) by mouth daily.  Modified Medications   No medications on file  Discontinued Medications   No medications on file     Physical Exam:  Filed Vitals:   06/29/15 0925  BP: 128/82  Pulse: 51  Temp: 98.1 F (36.7 C)  TempSrc: Oral  Resp: 20    Height: 5\' 8"  (1.727 m)  Weight: 130 lb 9.6 oz (59.24 kg)  SpO2: 98%    Physical Exam  Constitutional: He is oriented to person, place, and time. He appears well-developed and well-nourished. No distress.  HENT:  Head: Normocephalic and atraumatic.  Right Ear: External ear normal.  Left Ear: External ear normal.  Nose: Nose normal.  Mouth/Throat: Oropharynx is clear and moist. No oropharyngeal exudate.  Eyes: Conjunctivae and EOM are normal. Pupils are equal, round, and reactive to light.  Neck: Normal range of motion. Neck supple. No thyromegaly present.  Cardiovascular: Normal rate, regular rhythm, normal heart sounds and intact distal pulses.   Pulmonary/Chest: Effort normal and breath sounds normal.  Abdominal: Soft. Bowel sounds are normal.  Musculoskeletal: Normal range of motion. He exhibits no edema or tenderness.  Lymphadenopathy:    He has no cervical adenopathy.  Neurological: He is alert and oriented to person, place, and time. He has normal reflexes.  Skin: Skin is warm and dry. He is not diaphoretic.  Psychiatric: He has a normal mood and affect. Cognition and memory are impaired.    Labs reviewed: Basic Metabolic Panel: No results for input(s): NA, K, CL, CO2, GLUCOSE, BUN, CREATININE, CALCIUM, MG, PHOS, TSH in the last 8760 hours. Liver Function Tests: No results for input(s): AST, ALT, ALKPHOS, BILITOT, PROT, ALBUMIN in the last 8760 hours. No results for input(s): LIPASE, AMYLASE in the last 8760 hours. No results for input(s): AMMONIA in the last 8760 hours. CBC: No results for input(s): WBC, NEUTROABS, HGB, HCT, MCV, PLT in the last 8760 hours. Lipid Panel: No results for input(s): CHOL, HDL, LDLCALC, TRIG, CHOLHDL, LDLDIRECT in the last 8760 hours. TSH: No results for input(s): TSH in the last 8760 hours. A1C: No results found for: HGBA1C   Assessment/Plan 1. Medicare annual wellness visit, subsequent  The patient was counseled regarding the  appropriate use of alcohol,  prevention of dental and periodontal disease, diet, regular sustained exercise for at least 30 minutes 5 times per week, smoking cessation, tobacco use,  and recommended schedule for GI hemoccult testing, colonoscopy, cholesterol, thyroid and diabetes screening. Depression screening negative, no falls in the last year -MMSE stable from 2 years ago -living with wife who does all iADLs and helps with ADLs as needed -to increase exercise to 30 mins daily, as tolerates  -Tdap vaccine Rx given  2. Gastroesophageal reflux disease, esophagitis presence not specified -off protonix, no worsening of symptoms -cont to monitor   3. Anxiety state -has been stable on celexa, will decrease dose to 20 due to starting remeron - citalopram (CELEXA) 20 MG tablet; Take  1 tablet (20 mg total) by mouth daily. To take 1 tablet daily for anxiety  Dispense: 30 tablet; Refill: 3  4. Dementia, without behavioral disturbance Dementia has been stable, MMSE of 16/30, down from 17/30 2 years ago.  conts on Namzaric daily  5. Loss of weight -weight down 10 lbs in 7 months, taking ensure, appetite decreased  - mirtazapine (REMERON) 15 MG tablet; Take 1 tablet (15 mg total) by mouth at bedtime.  Dispense: 30 tablet; Refill: 3  6. Hyperlipidemia Does not wish to follow lipids at this time, does not want medication and due to weigh loss can not modify diet, will not follow up lipid panel  Follow up in 6 weeks on weigh tloss   Adams Hinch K. Harle Battiest  [facility] [phone] 8 am - 5 pm) [phone] (after hours)

## 2015-06-30 ENCOUNTER — Emergency Department (HOSPITAL_COMMUNITY): Payer: Medicare Other

## 2015-06-30 DIAGNOSIS — S199XXA Unspecified injury of neck, initial encounter: Secondary | ICD-10-CM | POA: Diagnosis not present

## 2015-06-30 DIAGNOSIS — S0990XA Unspecified injury of head, initial encounter: Secondary | ICD-10-CM | POA: Diagnosis not present

## 2015-06-30 LAB — COMPREHENSIVE METABOLIC PANEL
ALT: 15 IU/L (ref 0–44)
ALT: 17 U/L (ref 17–63)
AST: 19 IU/L (ref 0–40)
AST: 21 U/L (ref 15–41)
Albumin/Globulin Ratio: 2 (ref 1.1–2.5)
Albumin: 3.8 g/dL (ref 3.5–5.0)
Albumin: 4.3 g/dL (ref 3.5–4.8)
Alkaline Phosphatase: 71 U/L (ref 38–126)
Alkaline Phosphatase: 77 IU/L (ref 39–117)
Anion gap: 8 (ref 5–15)
BILIRUBIN TOTAL: 0.4 mg/dL (ref 0.0–1.2)
BUN / CREAT RATIO: 10 (ref 10–22)
BUN: 6 mg/dL (ref 6–20)
BUN: 8 mg/dL (ref 8–27)
CALCIUM: 9.2 mg/dL (ref 8.6–10.2)
CO2: 26 mmol/L (ref 18–29)
CO2: 26 mmol/L (ref 22–32)
CREATININE: 0.81 mg/dL (ref 0.76–1.27)
Calcium: 8.6 mg/dL — ABNORMAL LOW (ref 8.9–10.3)
Chloride: 105 mmol/L (ref 101–111)
Chloride: 98 mmol/L (ref 97–108)
Creatinine, Ser: 0.64 mg/dL (ref 0.61–1.24)
GFR calc Af Amer: 104 mL/min/{1.73_m2} (ref >59)
GFR calc Af Amer: >60 mL/min (ref >60)
GFR calc non Af Amer: >60 mL/min (ref >60)
GFR, EST NON AFRICAN AMERICAN: 90 mL/min/{1.73_m2} (ref >59)
Globulin, Total: 2.1 g/dL (ref 1.5–4.5)
Glucose, Bld: 100 mg/dL — ABNORMAL HIGH (ref 65–99)
Glucose: 72 mg/dL (ref 65–99)
Potassium: 3.7 mmol/L (ref 3.5–5.1)
Potassium: 4.3 mmol/L (ref 3.5–5.2)
Sodium: 139 mmol/L (ref 135–145)
Sodium: 140 mmol/L (ref 134–144)
TOTAL PROTEIN: 6.4 g/dL (ref 6.0–8.5)
Total Bilirubin: 0.5 mg/dL (ref 0.3–1.2)
Total Protein: 6.8 g/dL (ref 6.5–8.1)

## 2015-06-30 LAB — CBC WITH DIFFERENTIAL/PLATELET
BASOS: 0 %
Basophils Absolute: 0 10*3/uL (ref 0.0–0.1)
Basophils Absolute: 0 10*3/uL (ref 0.0–0.2)
Basophils Relative: 0 % (ref 0–1)
EOS (ABSOLUTE): 0.1 10*3/uL (ref 0.0–0.4)
Eos: 1 %
Eosinophils Absolute: 0.1 10*3/uL (ref 0.0–0.7)
Eosinophils Relative: 1 % (ref 0–5)
HCT: 38.3 % — ABNORMAL LOW (ref 39.0–52.0)
Hematocrit: 39.4 % (ref 37.5–51.0)
Hemoglobin: 13.1 g/dL (ref 13.0–17.0)
Hemoglobin: 13.7 g/dL (ref 12.6–17.7)
IMMATURE GRANS (ABS): 0 10*3/uL (ref 0.0–0.1)
IMMATURE GRANULOCYTES: 0 %
Lymphocytes Absolute: 1.7 10*3/uL (ref 0.7–3.1)
Lymphocytes Relative: 16 % (ref 12–46)
Lymphs Abs: 1.4 10*3/uL (ref 0.7–4.0)
Lymphs: 23 %
MCH: 31 pg (ref 26.0–34.0)
MCH: 31.1 pg (ref 26.6–33.0)
MCHC: 34.2 g/dL (ref 30.0–36.0)
MCHC: 34.8 g/dL (ref 31.5–35.7)
MCV: 90 fL (ref 79–97)
MCV: 90.5 fL (ref 78.0–100.0)
Monocytes Absolute: 0.4 10*3/uL (ref 0.1–0.9)
Monocytes Absolute: 0.4 10*3/uL (ref 0.1–1.0)
Monocytes Relative: 4 % (ref 3–12)
Monocytes: 6 %
Neutro Abs: 7.1 10*3/uL (ref 1.7–7.7)
Neutrophils Absolute: 5.1 10*3/uL (ref 1.4–7.0)
Neutrophils Relative %: 79 % — ABNORMAL HIGH (ref 43–77)
Neutrophils: 70 %
Platelets: 188 10*3/uL (ref 150–400)
Platelets: 212 10*3/uL (ref 150–379)
RBC: 4.23 MIL/uL (ref 4.22–5.81)
RBC: 4.4 x10E6/uL (ref 4.14–5.80)
RDW: 13.7 % (ref 11.5–15.5)
RDW: 14.5 % (ref 12.3–15.4)
WBC: 7.4 10*3/uL (ref 3.4–10.8)
WBC: 9 10*3/uL (ref 4.0–10.5)

## 2015-06-30 LAB — RAPID URINE DRUG SCREEN, HOSP PERFORMED
Amphetamines: NOT DETECTED
Barbiturates: NOT DETECTED
Benzodiazepines: POSITIVE — AB
Cocaine: NOT DETECTED
Opiates: NOT DETECTED
Tetrahydrocannabinol: NOT DETECTED

## 2015-06-30 LAB — URINALYSIS, ROUTINE W REFLEX MICROSCOPIC
Bilirubin Urine: NEGATIVE
Glucose, UA: NEGATIVE mg/dL
Hgb urine dipstick: NEGATIVE
Ketones, ur: NEGATIVE mg/dL
Leukocytes, UA: NEGATIVE
Nitrite: NEGATIVE
Protein, ur: NEGATIVE mg/dL
Specific Gravity, Urine: 1.008 (ref 1.005–1.030)
Urobilinogen, UA: 0.2 mg/dL (ref 0.0–1.0)
pH: 7 (ref 5.0–8.0)

## 2015-06-30 LAB — I-STAT CG4 LACTIC ACID, ED: Lactic Acid, Venous: 1.67 mmol/L (ref 0.5–2.0)

## 2015-06-30 LAB — ETHANOL: Alcohol, Ethyl (B): 172 mg/dL — ABNORMAL HIGH (ref <5)

## 2015-06-30 NOTE — ED Provider Notes (Signed)
CSN: [id number]     Arrival date & time 06/29/15  2221 History   First MD Initiated Contact with Patient 06/29/15 2227     Chief Complaint  Patient presents with  . Alcohol Intoxication     (Consider location/radiation/quality/duration/timing/severity/associated sxs/prior Treatment) HPI Patient presents to the emergency department with altered mental status.  The wife states that he became combative this evening.  She states today when they went to the doctor.  He was having no problems.  She has not from work and noticed the patient seemed altered and confused and she heard him fall.  The wife states that he may have drank apple [employer].  Patient was given 5 mg of Versed by EMS in route due to his combativeness.  Patient is unable to answer any questions.   Past Medical History  Diagnosis Date  . Alzheimer disease   . Diverticulitis   . Nodular prostate    Past Surgical History  Procedure Laterality Date  . Carpal tunnel with cubital tunnel  2012    Lamar Benes, MD  . Spine surgery  2005    Maia Breslow, MD  . Squamous cell carcinoma excision     Family History  Problem Relation Age of Onset  . Cancer Mother     bone  . Cancer Father     lung  . Dementia Father    History  Substance Use Topics  . Smoking status: Current Every Day Smoker -- 0.50 packs/day for 50 years    Types: Cigarettes  . Smokeless tobacco: Never Used     Comment: does not wish to stop smoking or talk about it. pt with hx of dementia and wife does not think this is worth the stress of stopping. pt does not want to stop  . Alcohol Use: 2.0 - 3.0 oz/week    4-6 drink(s) per week     Comment: occasionally- social drinker- does not drink every day    Review of Systems  Level 5 caveat applies due to altered mental status  Allergies  Morphine and related  Home Medications   Prior to Admission medications   Medication Sig Start Date End Date Taking? Authorizing Provider  citalopram (CELEXA)  20 MG tablet Take 1 tablet (20 mg total) by mouth daily. To take 1 tablet daily for anxiety 06/29/15  Yes Lauree Chandler, NP  fluticasone (FLONASE) 50 MCG/ACT nasal spray Place 2 sprays into both nostrils 2 (two) times daily as needed for allergies or rhinitis. 06/16/14  Yes Lauree Chandler, NP  Memantine HCl-Donepezil HCl (NAMZARIC) 28-10 MG CP24 Take 1 tablet by mouth daily. To preserve memory 09/19/14  Yes Tiffany L Reed, DO  mirtazapine (REMERON) 15 MG tablet Take 1 tablet (15 mg total) by mouth at bedtime. 06/29/15   Lauree Chandler, NP  Tdap Durwin Reges) 5-2.5-18.5 LF-MCG/0.5 injection Inject 0.5 mLs into the muscle once. 06/29/15   Lauree Chandler, NP   BP 136/82 mmHg  Pulse 59  Temp(Src) 97.4 F (36.3 C) (Axillary)  Resp 16  SpO2 100% Physical Exam  Constitutional: He is oriented to person, place, and time. He appears well-developed and well-nourished. No distress.  HENT:  Head: Normocephalic.  Mouth/Throat: Oropharynx is clear and moist.  Eyes: Pupils are equal, round, and reactive to light.  Neck: Normal range of motion. Neck supple.  Cardiovascular: Normal rate, regular rhythm and normal heart sounds.   Pulmonary/Chest: Effort normal and breath sounds normal. No respiratory distress.  Neurological: He  is alert and oriented to person, place, and time. He exhibits normal muscle tone. Coordination normal.  Skin: Skin is warm and dry. No erythema.  Psychiatric: He has a normal mood and affect. His behavior is normal.  Nursing note and vitals reviewed.   ED Course  Procedures (including critical care time) Labs Review Labs Reviewed  CBC WITH DIFFERENTIAL/PLATELET - Abnormal; Notable for the following:    HCT 38.3 (*)    Neutrophils Relative % 79 (*)    All other components within normal limits  COMPREHENSIVE METABOLIC PANEL - Abnormal; Notable for the following:    Glucose, Bld 100 (*)    Calcium 8.6 (*)    All other components within normal limits  ETHANOL - Abnormal;  Notable for the following:    Alcohol, Ethyl (B) 172 (*)    All other components within normal limits  URINE RAPID DRUG SCREEN, HOSP PERFORMED  URINALYSIS, ROUTINE W REFLEX MICROSCOPIC (NOT AT [facility])  I-STAT CG4 LACTIC ACID, ED    Imaging Review No results found.   EKG Interpretation   Date/Time:  Thursday June 29 2015 23:40:53 EDT Ventricular Rate:  57 PR Interval:  206 QRS Duration: 112 QT Interval:  494 QTC Calculation: 481 R Axis:   61 Text Interpretation:  Sinus rhythm Borderline intraventricular conduction  delay RSR' in V1 or V2, probably normal variant Repol abnrm suggests  ischemia, anterior leads Baseline wander in lead(s) V3 No old tracing to  compare Confirmed by OTTER  MD, OLGA ([id number]) on 06/29/2015 11:50:07 PM       The patient will be observed here in the ER.   Dalia Heading, PA-C 06/30/15 0111  Linton Flemings, MD 06/30/15 [phone]

## 2015-06-30 NOTE — ED Notes (Signed)
Radiology informed pt is currently calm enough to attempt CT scan.

## 2015-06-30 NOTE — ED Notes (Signed)
Pt alert, confused about situation, cooperative, given meal and PO fluids, warm blanket. PA-C made aware pt is more alert.

## 2015-06-30 NOTE — ED Provider Notes (Signed)
1:15 AM Patient signed out to me by Irena Cords, PA-C. Patient will be discharged when sober.   4:26 AM Patient clinically sober and is ready to go home. Patient's wife will come get him.   Results for orders placed or performed during the hospital encounter of 06/29/15  CBC with Differential  Result Value Ref Range   WBC 9.0 4.0 - 10.5 K/uL   RBC 4.23 4.22 - 5.81 MIL/uL   Hemoglobin 13.1 13.0 - 17.0 g/dL   HCT 38.3 (L) 39.0 - 52.0 %   MCV 90.5 78.0 - 100.0 fL   MCH 31.0 26.0 - 34.0 pg   MCHC 34.2 30.0 - 36.0 g/dL   RDW 13.7 11.5 - 15.5 %   Platelets 188 150 - 400 K/uL   Neutrophils Relative % 79 (H) 43 - 77 %   Neutro Abs 7.1 1.7 - 7.7 K/uL   Lymphocytes Relative 16 12 - 46 %   Lymphs Abs 1.4 0.7 - 4.0 K/uL   Monocytes Relative 4 3 - 12 %   Monocytes Absolute 0.4 0.1 - 1.0 K/uL   Eosinophils Relative 1 0 - 5 %   Eosinophils Absolute 0.1 0.0 - 0.7 K/uL   Basophils Relative 0 0 - 1 %   Basophils Absolute 0.0 0.0 - 0.1 K/uL  Comprehensive metabolic panel  Result Value Ref Range   Sodium 139 135 - 145 mmol/L   Potassium 3.7 3.5 - 5.1 mmol/L   Chloride 105 101 - 111 mmol/L   CO2 26 22 - 32 mmol/L   Glucose, Bld 100 (H) 65 - 99 mg/dL   BUN 6 6 - 20 mg/dL   Creatinine, Ser 0.64 0.61 - 1.24 mg/dL   Calcium 8.6 (L) 8.9 - 10.3 mg/dL   Total Protein 6.8 6.5 - 8.1 g/dL   Albumin 3.8 3.5 - 5.0 g/dL   AST 21 15 - 41 U/L   ALT 17 17 - 63 U/L   Alkaline Phosphatase 71 38 - 126 U/L   Total Bilirubin 0.5 0.3 - 1.2 mg/dL   GFR calc non Af Amer >60 >60 mL/min   GFR calc Af Amer >60 >60 mL/min   Anion gap 8 5 - 15  Ethanol  Result Value Ref Range   Alcohol, Ethyl (B) 172 (H) <5 mg/dL  I-Stat CG4 Lactic Acid, ED  Result Value Ref Range   Lactic Acid, Venous 1.67 0.5 - 2.0 mmol/L   Ct Head Wo Contrast  06/30/2015   CLINICAL DATA:  Unwitnessed fall.  EXAM: CT HEAD WITHOUT CONTRAST  CT CERVICAL SPINE WITHOUT CONTRAST  TECHNIQUE: Multidetector CT imaging of the head and cervical spine  was performed following the standard protocol without intravenous contrast. Multiplanar CT image reconstructions of the cervical spine were also generated.  COMPARISON:  None.  FINDINGS: CT HEAD FINDINGS  There is no intracranial hemorrhage, mass or evidence of acute infarction. There is moderate generalized atrophy. There is mild chronic microvascular ischemic change. There is no significant extra-axial fluid collection.  No acute intracranial findings are evident.  CT CERVICAL SPINE FINDINGS  The vertebral column, pedicles and facet articulations are intact. There is no evidence of acute fracture. No acute soft tissue abnormalities are evident.  There are mild-to-moderate degenerative disc changes from C5 through C7.  IMPRESSION: 1. Negative for acute intracranial traumatic injury. There is moderate generalized atrophy and mild chronic microvascular disease. 2. Negative for acute cervical spine fracture   Electronically Signed   By: Valerie Roys.D.  On: 06/30/2015 01:54   Ct Cervical Spine Wo Contrast  06/30/2015   CLINICAL DATA:  Unwitnessed fall.  EXAM: CT HEAD WITHOUT CONTRAST  CT CERVICAL SPINE WITHOUT CONTRAST  TECHNIQUE: Multidetector CT imaging of the head and cervical spine was performed following the standard protocol without intravenous contrast. Multiplanar CT image reconstructions of the cervical spine were also generated.  COMPARISON:  None.  FINDINGS: CT HEAD FINDINGS  There is no intracranial hemorrhage, mass or evidence of acute infarction. There is moderate generalized atrophy. There is mild chronic microvascular ischemic change. There is no significant extra-axial fluid collection.  No acute intracranial findings are evident.  CT CERVICAL SPINE FINDINGS  The vertebral column, pedicles and facet articulations are intact. There is no evidence of acute fracture. No acute soft tissue abnormalities are evident.  There are mild-to-moderate degenerative disc changes from C5 through C7.   IMPRESSION: 1. Negative for acute intracranial traumatic injury. There is moderate generalized atrophy and mild chronic microvascular disease. 2. Negative for acute cervical spine fracture   Electronically Signed   By: Andreas Newport M.D.   On: 06/30/2015 01:54      Alvina Chou, PA-C 06/30/15 3291  Linton Flemings, MD 07/03/15 [phone]

## 2015-06-30 NOTE — ED Notes (Signed)
Seizure pads added to siderails. Wife and other family member at bedside. Pt appears to be resting comfortably. Will continue to monitor.

## 2015-06-30 NOTE — ED Notes (Signed)
Pt's wife contacted twice about transporting pt home.

## 2015-07-20 ENCOUNTER — Telehealth: Payer: Self-pay

## 2015-07-20 NOTE — Telephone Encounter (Signed)
Patient lost pill bottle for namzeric. Patient will not be able to get another supply x 1 month. Patient's wife would like to know if we can supply samples x 1 month.  1 month supply placed at the front for pick-up. Left message on voicemail informing patient's wife

## 2015-08-10 ENCOUNTER — Ambulatory Visit: Payer: Medicare Other | Admitting: Nurse Practitioner

## 2015-08-17 ENCOUNTER — Encounter: Payer: Self-pay | Admitting: Nurse Practitioner

## 2015-08-17 ENCOUNTER — Ambulatory Visit (INDEPENDENT_AMBULATORY_CARE_PROVIDER_SITE_OTHER): Payer: Medicare Other | Admitting: Nurse Practitioner

## 2015-08-17 VITALS — BP 110/84 | HR 59 | Temp 98.0°F | Resp 18 | Ht 68.0 in | Wt 133.8 lb

## 2015-08-17 DIAGNOSIS — Z23 Encounter for immunization: Secondary | ICD-10-CM | POA: Diagnosis not present

## 2015-08-17 DIAGNOSIS — F411 Generalized anxiety disorder: Secondary | ICD-10-CM

## 2015-08-17 DIAGNOSIS — R634 Abnormal weight loss: Secondary | ICD-10-CM | POA: Diagnosis not present

## 2015-08-17 NOTE — Patient Instructions (Signed)
Received flu vaccine today Cont on Remeron   Follow up in 5 months

## 2015-08-17 NOTE — Progress Notes (Signed)
Patient ID: ERIS BRECK, male   DOB: [birth year], 69 y.o.   MRN: [id number]    PCP: Lauree Chandler, NP  Advanced Directive information Does patient have an advance directive?: Yes, Type of Advance Directive: Healthcare Power of Attorney  Allergies  Allergen Reactions  . Morphine And Related     Per pt: unknown    Chief Complaint  Patient presents with  . Medical Management of Chronic Issues    6 week follow-up on weight loss and yes he has started his Remeron  . Immunizations    had flu shot in jan. 28, 2016 so his records say not sure if right     HPI: Patient is a 69 y.o. male seen in the office today to follow up Remeron start. Pt has been losing weight with decrease appetite. Celexa was decrease to 20 mg daily and Remeron started. No changes or worsening in mood. appetite has improved. Has gained 3 lbs since last OV on 8.4.16.  No daytime sleepiness.   Was seen in ED since last visit due to intoxication, wife has removed all liquor from home, drinking 2 beers a day. No further episodes of intoxication.  Review of Systems:  Review of Systems  Constitutional: Positive for appetite change (improved) and unexpected weight change (weight loss noted at last OV, now improved). Negative for fever and chills.  Respiratory: Negative for cough and shortness of breath.   Cardiovascular: Negative for chest pain and palpitations.  Gastrointestinal: Negative for diarrhea and constipation.  Genitourinary: Negative for dysuria and frequency.  Musculoskeletal: Negative for myalgias and back pain.  Skin: Negative.   Neurological: Negative for dizziness and headaches.  Psychiatric/Behavioral: Positive for confusion (memory loss).    Past Medical History  Diagnosis Date  . Alzheimer disease   . Diverticulitis   . Nodular prostate    Past Surgical History  Procedure Laterality Date  . Carpal tunnel with cubital tunnel  2012    Lamar Benes, MD  . Spine surgery  2005   Maia Breslow, MD  . Squamous cell carcinoma excision     Social History:   reports that he has been smoking Cigarettes.  He has a 25 pack-year smoking history. He has never used smokeless tobacco. He reports that he drinks about 2.0 - 3.0 oz of alcohol per week. He reports that he does not use illicit drugs.  Family History  Problem Relation Age of Onset  . Cancer Mother     bone  . Cancer Father     lung  . Dementia Father     Medications: Patient's Medications  New Prescriptions   No medications on file  Previous Medications   CITALOPRAM (CELEXA) 20 MG TABLET    Take 1 tablet (20 mg total) by mouth daily. To take 1 tablet daily for anxiety   FLUTICASONE (FLONASE) 50 MCG/ACT NASAL SPRAY    Place 2 sprays into both nostrils 2 (two) times daily as needed for allergies or rhinitis.   MEMANTINE HCL-DONEPEZIL HCL (NAMZARIC) 28-10 MG CP24    Take 1 tablet by mouth daily. To preserve memory   MIRTAZAPINE (REMERON) 15 MG TABLET    Take 1 tablet (15 mg total) by mouth at bedtime.   TDAP (BOOSTRIX) 5-2.5-18.5 LF-MCG/0.5 INJECTION    Inject 0.5 mLs into the muscle once.  Modified Medications   No medications on file  Discontinued Medications   No medications on file     Physical Exam:  Filed Vitals:  08/17/15 1011  BP: 110/84  Pulse: 59  Temp: 98 F (36.7 C)  TempSrc: Oral  Resp: 18  Height: 5\' 8"  (1.727 m)  Weight: 133 lb 12.8 oz (60.691 kg)  SpO2: 97%   Body mass index is 20.35 kg/(m^2).  Physical Exam  Constitutional: No distress.  HENT:  Head: Normocephalic and atraumatic.  Neck: Normal range of motion. Neck supple.  Cardiovascular: Normal rate, regular rhythm and normal heart sounds.   Pulmonary/Chest: Effort normal and breath sounds normal.  Musculoskeletal: He exhibits no edema.  Neurological: He is alert.  Skin: Skin is warm and dry. He is not diaphoretic.  Psychiatric: He has a normal mood and affect.    Labs reviewed: Basic Metabolic Panel:  Recent  Labs  06/29/15 1009 06/29/15 2353  NA 140 139  K 4.3 3.7  CL 98 105  CO2 26 26  GLUCOSE 72 100*  BUN 8 6  CREATININE 0.81 0.64  CALCIUM 9.2 8.6*   Liver Function Tests:  Recent Labs  06/29/15 1009 06/29/15 2353  AST 19 21  ALT 15 17  ALKPHOS 77 71  BILITOT 0.4 0.5  PROT 6.4 6.8  ALBUMIN  --  3.8   No results for input(s): LIPASE, AMYLASE in the last 8760 hours. No results for input(s): AMMONIA in the last 8760 hours. CBC:  Recent Labs  06/29/15 1009 06/29/15 2353  WBC 7.4 9.0  NEUTROABS 5.1 7.1  HGB  --  13.1  HCT 39.4 38.3*  MCV  --  90.5  PLT  --  188   Lipid Panel: No results for input(s): CHOL, HDL, LDLCALC, TRIG, CHOLHDL, LDLDIRECT in the last 8760 hours. TSH: No results for input(s): TSH in the last 8760 hours. A1C: No results found for: HGBA1C   Assessment/Plan 1. Loss of weight -improved appetite with Remeron, weight up 3 lbs since last visit, eating better. Will cont at this time.   2. Anxiety state -stable, without changes in anxiety or behaviors -conts on celexa 20 mg daily    Jessica K. Harle Battiest  [facility] [phone] 8 am - 5 pm) [phone] (after hours)

## 2015-09-07 ENCOUNTER — Other Ambulatory Visit: Payer: Self-pay | Admitting: *Deleted

## 2015-09-07 DIAGNOSIS — R634 Abnormal weight loss: Secondary | ICD-10-CM

## 2015-09-07 MED ORDER — MIRTAZAPINE 15 MG PO TABS: 15.0000 mg | ORAL_TABLET | Freq: Every day | ORAL | Status: DC

## 2015-09-07 NOTE — Telephone Encounter (Signed)
90 day supply request from [facility]

## 2015-10-17 ENCOUNTER — Other Ambulatory Visit: Payer: Self-pay | Admitting: Internal Medicine

## 2015-12-26 ENCOUNTER — Encounter: Payer: Self-pay | Admitting: Nurse Practitioner

## 2015-12-26 ENCOUNTER — Ambulatory Visit (INDEPENDENT_AMBULATORY_CARE_PROVIDER_SITE_OTHER): Payer: Medicare Other | Admitting: Nurse Practitioner

## 2015-12-26 VITALS — BP 128/88 | HR 56 | Temp 98.0°F | Resp 20 | Ht 68.0 in | Wt 139.8 lb

## 2015-12-26 DIAGNOSIS — K219 Gastro-esophageal reflux disease without esophagitis: Secondary | ICD-10-CM

## 2015-12-26 DIAGNOSIS — R634 Abnormal weight loss: Secondary | ICD-10-CM | POA: Diagnosis not present

## 2015-12-26 DIAGNOSIS — F0280 Dementia in other diseases classified elsewhere without behavioral disturbance: Secondary | ICD-10-CM

## 2015-12-26 DIAGNOSIS — E785 Hyperlipidemia, unspecified: Secondary | ICD-10-CM

## 2015-12-26 DIAGNOSIS — F411 Generalized anxiety disorder: Secondary | ICD-10-CM

## 2015-12-26 DIAGNOSIS — G309 Alzheimer's disease, unspecified: Secondary | ICD-10-CM

## 2015-12-26 MED ORDER — OMEPRAZOLE 20 MG PO CPDR: 20.0000 mg | DELAYED_RELEASE_CAPSULE | Freq: Every day | ORAL | Status: DC

## 2015-12-26 NOTE — Progress Notes (Signed)
Patient ID: Christian [name], male   DOB: 29-Jun-1946, 70 y.o.   MRN: [id number]    PCP: Lauree Chandler, NP  Advanced Directive information Does patient have an advance directive?: Yes, Type of Advance Directive: Healthcare Power of Attorney;Mental Health Advance Directive, Does patient want to make changes to advanced directive?: No - Patient declined  Allergies  Allergen Reactions  . Morphine And Related     Per pt: unknown    Chief Complaint  Patient presents with  . Medical Management of Chronic Issues    5 mont6h follow-up for Alzheimer     HPI: Patient is a 70 y.o. male seen in the office today to follow up on chronic conditions. Pt with dementia, weight loss, anxiety, tobacco abuse.  Not eating well- drinking ensure at least 1-2 times daily Increased confusion at christmas.  Memory comes and goes. Still having conversations that are advanced but then will not remember them later.  Still complaining of stomach aches but very vague symptoms. Uncomfortable all the time, not associated with food. Coffee creamer helps.  No constipation or diarrhea.   Review of Systems:  Review of Systems  Constitutional: Negative for fever, chills and unexpected weight change.  Respiratory: Negative for cough and shortness of breath.   Cardiovascular: Negative for chest pain, palpitations and leg swelling.  Gastrointestinal: Positive for abdominal pain (abdominal discomfort). Negative for diarrhea and constipation.  Genitourinary: Negative for dysuria and frequency.  Musculoskeletal: Negative for myalgias and back pain.  Skin: Negative.   Neurological: Negative for dizziness and headaches.  Psychiatric/Behavioral: Positive for confusion (memory loss).    Past Medical History  Diagnosis Date  . Alzheimer disease   . Diverticulitis   . Nodular prostate    Past Surgical History  Procedure Laterality Date  . Carpal tunnel with cubital tunnel  2012    Lamar Benes, MD  . Spine  surgery  2005    Maia Breslow, MD  . Squamous cell carcinoma excision     Social History:   reports that he has been smoking Cigarettes.  He has a 25 pack-year smoking history. He has never used smokeless tobacco. He reports that he drinks about 2.0 - 3.0 oz of alcohol per week. He reports that he does not use illicit drugs.  Family History  Problem Relation Age of Onset  . Cancer Mother     bone  . Cancer Father     lung  . Dementia Father     Medications: Patient's Medications  New Prescriptions   No medications on file  Previous Medications   CITALOPRAM (CELEXA) 20 MG TABLET    Take 1 tablet (20 mg total) by mouth daily. To take 1 tablet daily for anxiety   FLUTICASONE (FLONASE) 50 MCG/ACT NASAL SPRAY    Place 2 sprays into both nostrils 2 (two) times daily as needed for allergies or rhinitis.   MIRTAZAPINE (REMERON) 15 MG TABLET    Take 1 tablet (15 mg total) by mouth at bedtime.   NAMZARIC 28-10 MG CP24    TAKE 1 CAPSULE BY MOUTH DAILY TO PRESERVE MEMORY   TDAP (BOOSTRIX) 5-2.5-18.5 LF-MCG/0.5 INJECTION    Inject 0.5 mLs into the muscle once.  Modified Medications   No medications on file  Discontinued Medications   No medications on file     Physical Exam:  Filed Vitals:   12/26/15 1004  BP: 128/88  Pulse: 56  Temp: 98 F (36.7 C)  TempSrc: Oral  Resp: 20  Height: 5\' 8"  (1.727 m)  Weight: 139 lb 12.8 oz (63.413 kg)  SpO2: 98%   Body mass index is 21.26 kg/(m^2).  Physical Exam  Constitutional: No distress.  HENT:  Head: Normocephalic and atraumatic.  Neck: Normal range of motion. Neck supple.  Cardiovascular: Normal rate, regular rhythm and normal heart sounds.   Pulmonary/Chest: Effort normal and breath sounds normal.  Musculoskeletal: He exhibits no edema.  Neurological: He is alert.  Skin: Skin is warm and dry. He is not diaphoretic.  Psychiatric: He has a normal mood and affect.    Labs reviewed: Basic Metabolic Panel:  Recent Labs   06/29/15 1009 06/29/15 2353  NA 140 139  K 4.3 3.7  CL 98 105  CO2 26 26  GLUCOSE 72 100*  BUN 8 6  CREATININE 0.81 0.64  CALCIUM 9.2 8.6*   Liver Function Tests:  Recent Labs  06/29/15 1009 06/29/15 2353  AST 19 21  ALT 15 17  ALKPHOS 77 71  BILITOT 0.4 0.5  PROT 6.4 6.8  ALBUMIN 4.3 3.8   No results for input(s): LIPASE, AMYLASE in the last 8760 hours. No results for input(s): AMMONIA in the last 8760 hours. CBC:  Recent Labs  06/29/15 1009 06/29/15 2353  WBC 7.4 9.0  NEUTROABS 5.1 7.1  HGB  --  13.1  HCT 39.4 38.3*  MCV 90 90.5  PLT 212 188   Lipid Panel: No results for input(s): CHOL, HDL, LDLCALC, TRIG, CHOLHDL, LDLDIRECT in the last 8760 hours. TSH: No results for input(s): TSH in the last 8760 hours. A1C: No results found for: HGBA1C   Assessment/Plan 1. Gastroesophageal reflux disease without esophagitis Vague abdominal complaints, with hx of GERD will start omeprazole 20 mg daily  - CBC with Differential; Future - omeprazole (PRILOSEC) 20 MG capsule; Take 1 capsule (20 mg total) by mouth daily.  Dispense: 30 capsule; Refill: 3  2. Alzheimer disease -has been stable, conts on namzaric   3. Loss of weight -has been stable, appetite still poor, conts on nutritional supplement and remeron  - Comprehensive metabolic panel; Future  4. Anxiety state Stable on celexa. To cont current medication - Basic metabolic panel  5. Hyperlipidemia LDL goal <130 -eats heart healthy diet. LDL slightly elevated last year,  Will follow up fasting lab work prior to next visit - Comprehensive metabolic panel; Future - Lipid panel; Future   Youcef Klas K. Harle Battiest  [facility] [phone] 8 am - 5 pm) [phone] (after hours)

## 2015-12-26 NOTE — Patient Instructions (Signed)
To cont medications, will start omeprazole 20 mg daily to see if this helps with stomach discomfort   Follow up in 6 months with fasting blood work prior to visit for physical

## 2015-12-27 LAB — BASIC METABOLIC PANEL
BUN / CREAT RATIO: 11 (ref 10–22)
BUN: 9 mg/dL (ref 8–27)
CO2: 25 mmol/L (ref 18–29)
CREATININE: 0.82 mg/dL (ref 0.76–1.27)
Calcium: 9.4 mg/dL (ref 8.6–10.2)
Chloride: 99 mmol/L (ref 96–106)
GFR calc non Af Amer: 90 mL/min/{1.73_m2} (ref >59)
GFR, EST AFRICAN AMERICAN: 104 mL/min/{1.73_m2} (ref >59)
Glucose: 85 mg/dL (ref 65–99)
Potassium: 4.7 mmol/L (ref 3.5–5.2)
Sodium: 141 mmol/L (ref 134–144)

## 2016-01-05 IMAGING — CT CT HEAD W/O CM
2 of 6 series · 12 of 47 positions shown, 15 images · non-contrast
Comparison: None.

CLINICAL DATA: Unwitnessed fall.

EXAM:
CT HEAD WITHOUT CONTRAST
CT CERVICAL SPINE WITHOUT CONTRAST
TECHNIQUE: Multidetector CT imaging of the head and cervical spine was
performed following the standard protocol without intravenous
contrast. Multiplanar CT image reconstructions of the cervical spine
were also generated.

[Series 7: axial recon · axial · 0.19mm/px · z∈[-381,-260]mm · 9 of 91 slices shown, 12 images]
[im 10/91  brain]
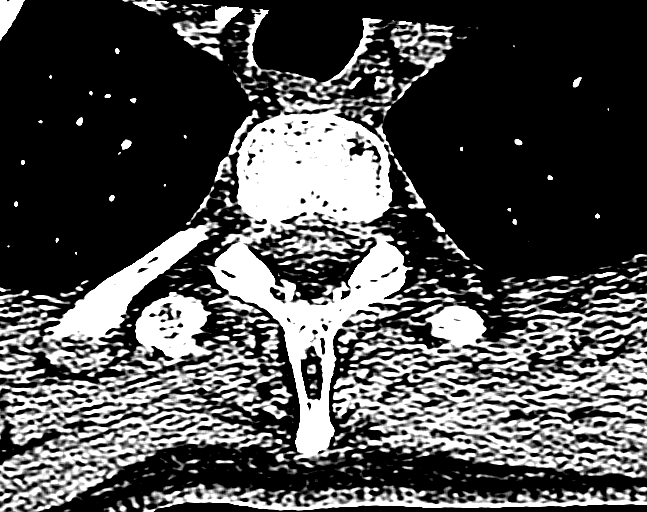
[im 10/91  bone]
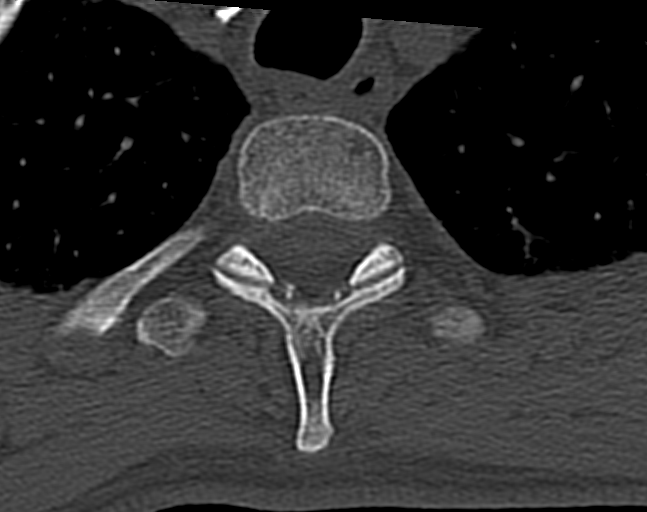
[im 19/91  brain]
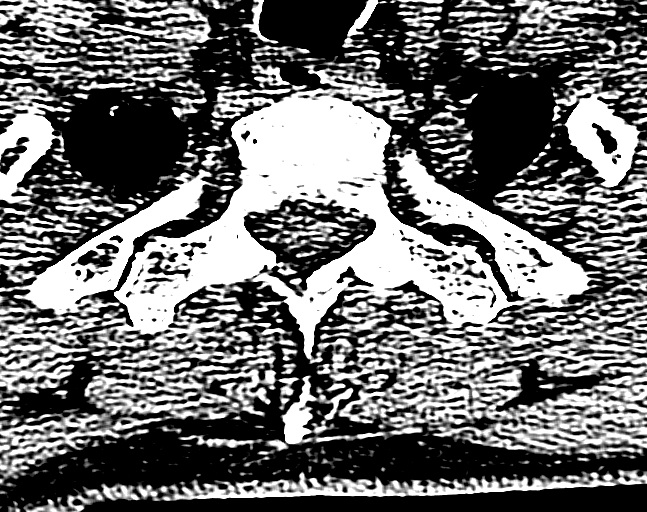
[im 28/91  brain]
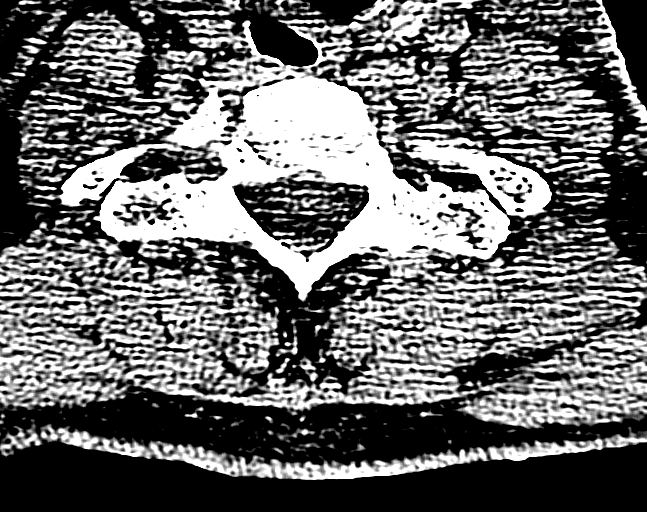
[im 37/91  brain]
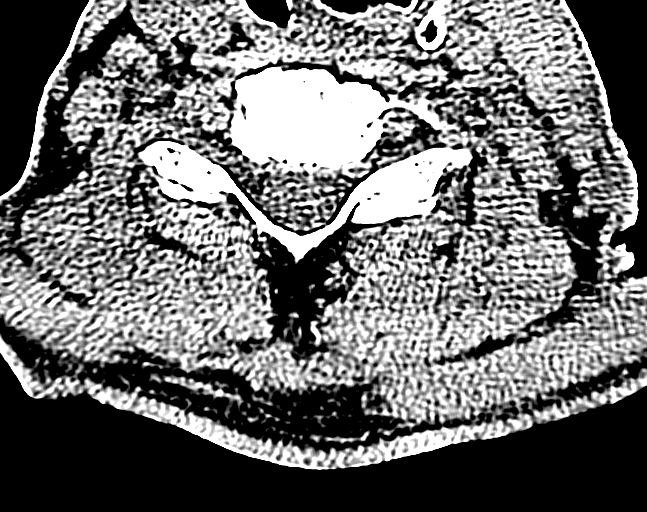
[im 46/91  brain]
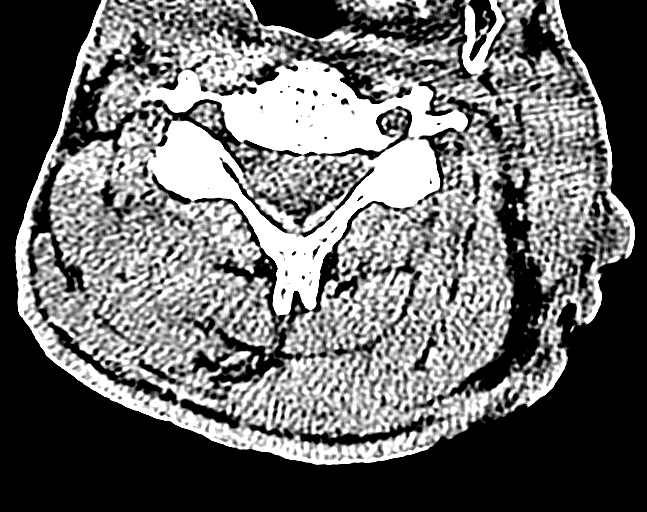
[im 46/91  bone]
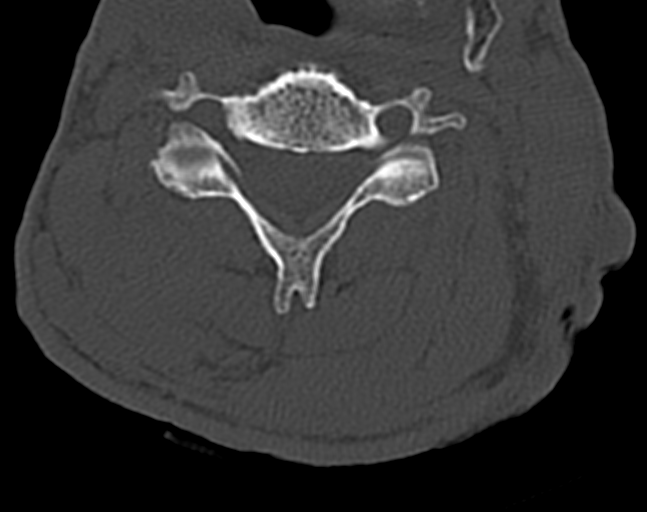
[im 55/91  brain]
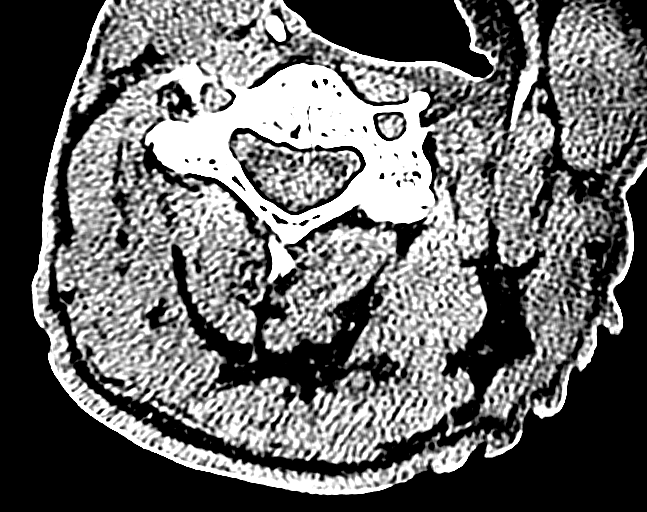
[im 64/91  brain]
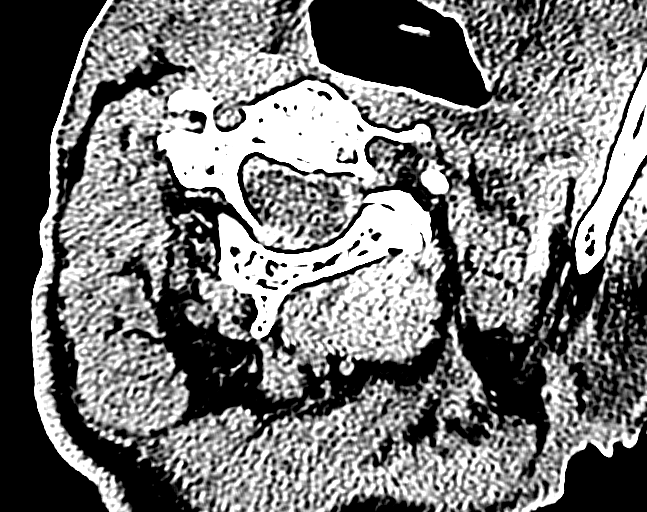
[im 73/91  brain]
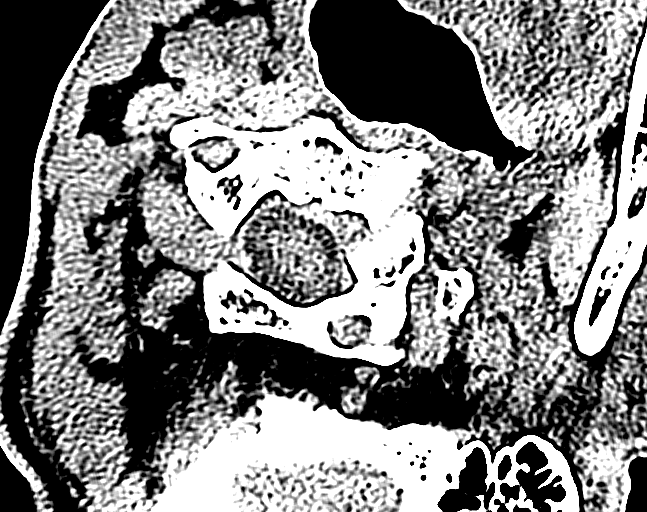
[im 82/91  brain]
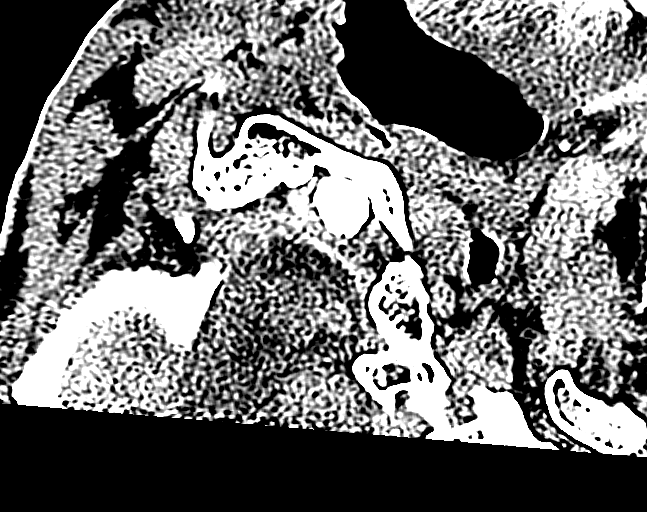
[im 82/91  bone]
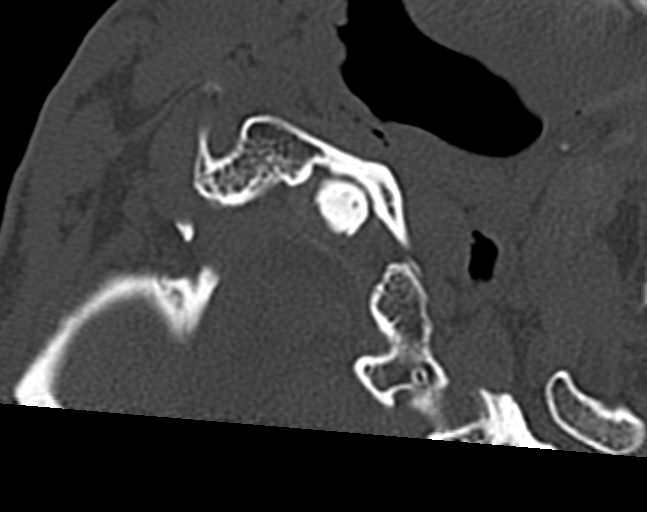

[Series 8: coronal · coronal · 0.23mm/px · 3 of 43 slices shown]
[im 15/43  brain]
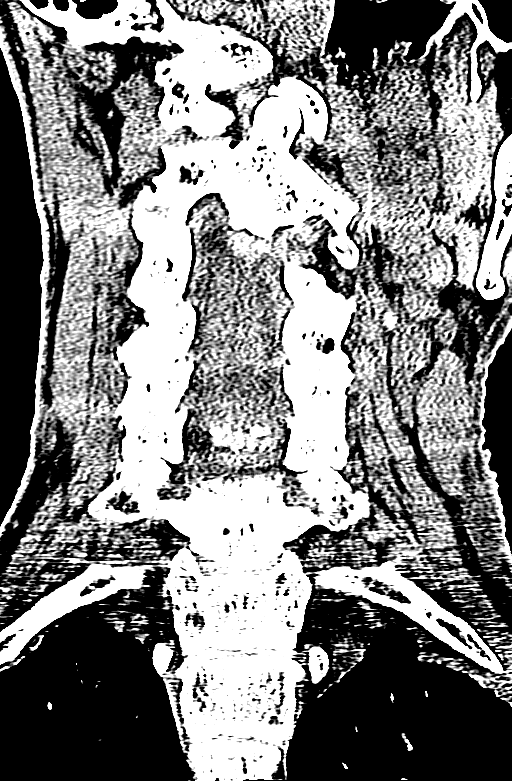
[im 19/43  brain]
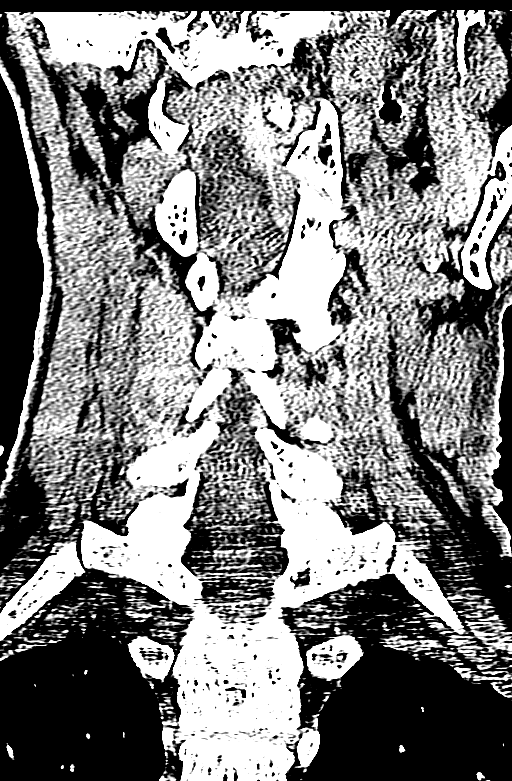
[im 24/43  brain]
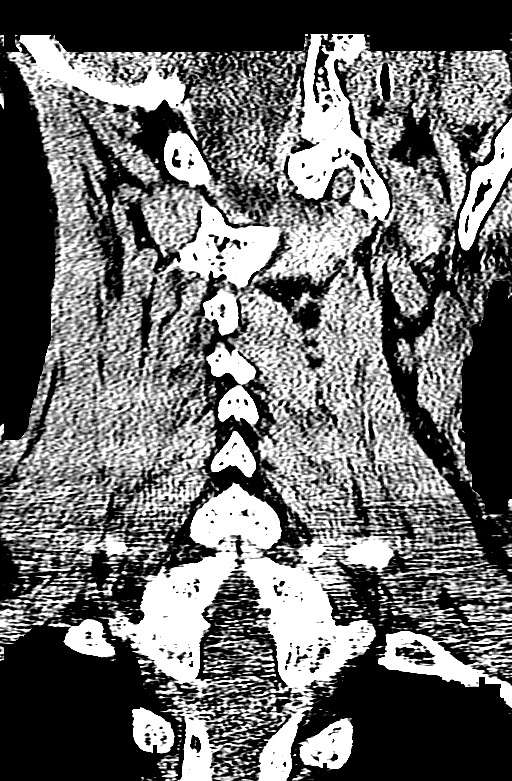

[12 of 47 positions shown; findings below may reference images not displayed]

FINDINGS: CT HEAD FINDINGS

There is no intracranial hemorrhage, mass or evidence of acute
infarction. There is moderate generalized atrophy. There is mild
chronic microvascular ischemic change. There is no significant
extra-axial fluid collection.

No acute intracranial findings are evident.

CT CERVICAL SPINE FINDINGS

The vertebral column, pedicles and facet articulations are intact.
There is no evidence of acute fracture. No acute soft tissue
abnormalities are evident.

There are mild-to-moderate degenerative disc changes from C5 through
C7.
IMPRESSION: 1. Negative for acute intracranial traumatic injury. There is
moderate generalized atrophy and mild chronic microvascular disease.
2. Negative for acute cervical spine fracture

## 2016-02-04 ENCOUNTER — Other Ambulatory Visit: Payer: Self-pay | Admitting: Nurse Practitioner

## 2016-03-25 ENCOUNTER — Other Ambulatory Visit: Payer: Self-pay | Admitting: *Deleted

## 2016-03-25 MED ORDER — CITALOPRAM HYDROBROMIDE 20 MG PO TABS: ORAL_TABLET | ORAL | Status: DC

## 2016-03-25 NOTE — Telephone Encounter (Signed)
[facility]

## 2016-05-13 ENCOUNTER — Other Ambulatory Visit: Payer: Self-pay | Admitting: Nurse Practitioner

## 2016-05-23 ENCOUNTER — Encounter: Payer: Self-pay | Admitting: Nurse Practitioner

## 2016-05-23 ENCOUNTER — Ambulatory Visit (INDEPENDENT_AMBULATORY_CARE_PROVIDER_SITE_OTHER): Payer: Medicare Other | Admitting: Nurse Practitioner

## 2016-05-23 VITALS — BP 128/84 | HR 56 | Temp 98.0°F | Resp 18 | Ht 68.0 in | Wt 139.4 lb

## 2016-05-23 DIAGNOSIS — R1033 Periumbilical pain: Secondary | ICD-10-CM | POA: Diagnosis not present

## 2016-05-23 DIAGNOSIS — R197 Diarrhea, unspecified: Secondary | ICD-10-CM | POA: Diagnosis not present

## 2016-05-23 DIAGNOSIS — R634 Abnormal weight loss: Secondary | ICD-10-CM | POA: Diagnosis not present

## 2016-05-23 MED ORDER — ZOSTER VACCINE LIVE 19400 UNT/0.65ML ~~LOC~~ SUSR: 0.6500 mL | Freq: Once | SUBCUTANEOUS | Status: DC

## 2016-05-23 MED ORDER — TETANUS-DIPHTH-ACELL PERTUSSIS 5-2.5-18.5 LF-MCG/0.5 IM SUSP: 0.5000 mL | Freq: Once | INTRAMUSCULAR | Status: DC

## 2016-05-23 NOTE — Progress Notes (Signed)
Patient ID: SKYE MCCLAMMY, male   DOB: Jan 03, 1946, 70 y.o.   MRN: [id number]    PCP: Lauree Chandler, NP  Advanced Directive information Does patient have an advance directive?: Yes, Type of Advance Directive: Living will, Does patient want to make changes to advanced directive?: No - Patient declined  Allergies  Allergen Reactions  . Morphine And Related     Per pt: unknown    Chief Complaint  Patient presents with  . Medical Management of Chronic Issues    Stomach feels tight at naval, diarrhea everyday x 1 month, no N&V. tightness occurs around midmorning (does not matter if he has eaten or not)  . OTHER    Ezequiel Ganser, in room     HPI: Patient is a 70 y.o. male seen in the office today due to stomach pain with diarrhea for about a month. No pain with bowels, having 2 loose bowel movements a day. Tender/uncomfortable/dull pain around naval area when he pushes down on it Memory problems and speech problems have gotten worse as well Has been on omeprazole for about 6 months.  No recent travel, no sick contacts, no antibiotic use.  Appetite is poor, no breakfast, will skip lunch but then eat double portion at dinner.  No GERD Drinking 16 cups of coffee a day, not a lot of water   Review of Systems:  Review of Systems  Constitutional: Negative for fever, chills and unexpected weight change.  Respiratory: Negative for cough and shortness of breath.   Cardiovascular: Negative for chest pain, palpitations and leg swelling.  Gastrointestinal: Positive for abdominal pain (abdominal discomfort). Negative for diarrhea and constipation.  Genitourinary: Negative for dysuria and frequency.  Musculoskeletal: Negative for myalgias and back pain.  Skin: Negative.   Neurological: Negative for dizziness and headaches.  Psychiatric/Behavioral: Positive for confusion (memory loss).    Past Medical History  Diagnosis Date  . Alzheimer disease   . Diverticulitis   . Nodular  prostate    Past Surgical History  Procedure Laterality Date  . Carpal tunnel with cubital tunnel  2012    Lamar Benes, MD  . Spine surgery  2005    Maia Breslow, MD  . Squamous cell carcinoma excision     Social History:   reports that he has been smoking Cigarettes.  He has a 25 pack-year smoking history. He has never used smokeless tobacco. He reports that he drinks about 2.0 - 3.0 oz of alcohol per week. He reports that he does not use illicit drugs.  Family History  Problem Relation Age of Onset  . Cancer Mother     bone  . Cancer Father     lung  . Dementia Father     Medications: Patient's Medications  New Prescriptions   No medications on file  Previous Medications   CITALOPRAM (CELEXA) 20 MG TABLET    Take one tablet by mouth once daily for anxiety   FLUTICASONE (FLONASE) 50 MCG/ACT NASAL SPRAY    Place 2 sprays into both nostrils 2 (two) times daily as needed for allergies or rhinitis.   MIRTAZAPINE (REMERON) 15 MG TABLET    Take 1 tablet (15 mg total) by mouth at bedtime.   NAMZARIC 28-10 MG CP24    TAKE 1 CAPSULE BY MOUTH DAILY TO PRESERVE MEMORY   OMEPRAZOLE (PRILOSEC) 20 MG CAPSULE    TAKE 1 CAPSULE (20 MG TOTAL) BY MOUTH DAILY.  Modified Medications   Modified Medication Previous Medication  TDAP (BOOSTRIX) 5-2.5-18.5 LF-MCG/0.5 INJECTION Tdap (BOOSTRIX) 5-2.5-18.5 LF-MCG/0.5 injection      Inject 0.5 mLs into the muscle once.    Inject 0.5 mLs into the muscle once.   ZOSTER VACCINE LIVE, PF, (ZOSTAVAX) 13086 UNT/0.65ML INJECTION Zoster Vaccine Live, PF, (ZOSTAVAX) 57846 UNT/0.65ML injection      Inject 19,400 Units into the skin once.    Inject 0.65 mLs into the skin once.  Discontinued Medications   No medications on file     Physical Exam:  Filed Vitals:   05/23/16 1315  BP: 128/84  Pulse: 56  Temp: 98 F (36.7 C)  TempSrc: Oral  Resp: 18  Height: 5\' 8"  (1.727 m)  Weight: 139 lb 6.4 oz (63.231 kg)  SpO2: 94%   Body mass index is 21.2  kg/(m^2).  Physical Exam  Constitutional: No distress.  HENT:  Head: Normocephalic and atraumatic.  Neck: Normal range of motion. Neck supple.  Cardiovascular: Normal rate, regular rhythm and normal heart sounds.   Pulmonary/Chest: Effort normal and breath sounds normal.  Musculoskeletal: He exhibits no edema.  Neurological: He is alert.  Skin: Skin is warm and dry. He is not diaphoretic.  Psychiatric: He has a normal mood and affect.    Labs reviewed: Basic Metabolic Panel:  Recent Labs  06/29/15 1009 06/29/15 2353 12/26/15 1053  NA 140 139 141  K 4.3 3.7 4.7  CL 98 105 99  CO2 26 26 25   GLUCOSE 72 100* 85  BUN 8 6 9   CREATININE 0.81 0.64 0.82  CALCIUM 9.2 8.6* 9.4   Liver Function Tests:  Recent Labs  06/29/15 1009 06/29/15 2353  AST 19 21  ALT 15 17  ALKPHOS 77 71  BILITOT 0.4 0.5  PROT 6.4 6.8  ALBUMIN 4.3 3.8   No results for input(s): LIPASE, AMYLASE in the last 8760 hours. No results for input(s): AMMONIA in the last 8760 hours. CBC:  Recent Labs  06/29/15 1009 06/29/15 2353  WBC 7.4 9.0  NEUTROABS 5.1 7.1  HGB  --  13.1  HCT 39.4 38.3*  MCV 90 90.5  PLT 212 188   Lipid Panel: No results for input(s): CHOL, HDL, LDLCALC, TRIG, CHOLHDL, LDLDIRECT in the last 8760 hours. TSH: No results for input(s): TSH in the last 8760 hours. A1C: No results found for: HGBA1C   Assessment/Plan 1. Diarrhea, unspecified type and abdominal pain  -may be due to 16 cups of coffee a day, will have pt cut back to 2 cups of coffee a day for now and stop omeprazole  - Comprehensive metabolic panel - Amylase - Lipase - CBC with Differential/Platelets - Ova and Parasite Exam - Stool culture   2. Loss of weight Weight remains stable, encouraged 3 meals a day and to cont supplements -conts on remeron    Azana Kiesler K. Harle Battiest  [facility] [phone] 8 am - 5 pm) [phone] (after hours)

## 2016-05-23 NOTE — Addendum Note (Signed)
Addended by: Denyse Amass on: 05/23/2016 02:12 PM   Modules accepted: Orders

## 2016-05-23 NOTE — Patient Instructions (Signed)
Cut back on coffee- 2 cups a day at most  Increase water  Stop omeprazole   Encourage 3 meals a day  Cont ensure

## 2016-05-30 ENCOUNTER — Telehealth: Payer: Self-pay

## 2016-05-30 NOTE — Telephone Encounter (Signed)
I called Christian [name] (wife) to discuss whether or not they were going to get the stool culture, O & P exam, CMP and CBC with Diff/Platelet that [facility] ordered.   [name] stated that she had just gotten back into town, so she has not had a chance to help her husband get these done. She plans to start working on getting these done today.

## 2016-06-04 ENCOUNTER — Other Ambulatory Visit: Payer: Self-pay

## 2016-06-04 DIAGNOSIS — R197 Diarrhea, unspecified: Secondary | ICD-10-CM | POA: Diagnosis not present

## 2016-06-04 DIAGNOSIS — E785 Hyperlipidemia, unspecified: Secondary | ICD-10-CM

## 2016-06-04 LAB — BASIC METABOLIC PANEL
BUN: 12 mg/dL (ref 4–21)
Creatinine: 0.8 mg/dL (ref 0.6–1.3)
Glucose: 84 mg/dL
Potassium: 4.1 mmol/L (ref 3.4–5.3)
Sodium: 146 mmol/L (ref 137–147)

## 2016-06-04 LAB — CBC AND DIFFERENTIAL
HEMATOCRIT: 43 % (ref 41–53)
HEMOGLOBIN: 14.2 g/dL (ref 13.5–17.5)
Platelets: 236 10*3/uL (ref 150–399)
WBC: 7.9 10^3/mL

## 2016-06-04 LAB — HEPATIC FUNCTION PANEL
ALK PHOS: 98 U/L (ref 25–125)
ALT: 9 U/L — AB (ref 10–40)
AST: 16 U/L (ref 14–40)
BILIRUBIN, TOTAL: 0.2 mg/dL

## 2016-06-05 ENCOUNTER — Encounter: Payer: Self-pay | Admitting: *Deleted

## 2016-06-06 ENCOUNTER — Telehealth: Payer: Self-pay

## 2016-06-06 NOTE — Telephone Encounter (Signed)
Attempted to call patient but home phone was disconnected and mobile number had a full mailbox that could not accept new messages.   Called to give patient lab results that were faxed to the office from [facility]. These results will be sent via mychart.

## 2016-06-11 ENCOUNTER — Encounter: Payer: Self-pay | Admitting: *Deleted

## 2016-06-17 ENCOUNTER — Telehealth: Payer: Self-pay

## 2016-06-17 NOTE — Addendum Note (Signed)
Addended by: Denyse Amass on: 06/17/2016 11:42 AM   Modules accepted: Orders

## 2016-06-17 NOTE — Telephone Encounter (Signed)
I called Clarita Crane to inform her of the results for her husbands lab work that was faxed to our office from [facility]. The results were for a CBC with differential/Platelet, Complete Metabolic Panel, Stool Culture, Amylase and Lipase Serum, and an Ova & Parasite Exam. All lab results were normal and faxed forms were sent for scanning.   When I talked Mrs. Sabra Heck, I informed her that the lipid panel was not included in the results that were faxed from to our office. She stated that she would like to have those done at her husband's appointment on 07/04/16. While checking the appointment date, I saw that patient has a lab appointment on 07/01/16. Mrs. Gean stated that she would have the lipid panel drawn on that day. A short time later, Mrs. Louison called the office to request that the lab appointment for 07/01/16 be cancelled and the lab orders be sent to the [facility] drawing station.   I faxed the order to [facility] [address] [place] [address].

## 2016-06-21 ENCOUNTER — Other Ambulatory Visit: Payer: Self-pay | Admitting: Nurse Practitioner

## 2016-06-27 DIAGNOSIS — E785 Hyperlipidemia, unspecified: Secondary | ICD-10-CM | POA: Diagnosis not present

## 2016-06-27 LAB — LIPID PANEL
Cholesterol: 200 mg/dL (ref 0–200)
HDL: 49 mg/dL (ref 35–70)
LDL Cholesterol: 136 mg/dL
TRIGLYCERIDES: 75 mg/dL (ref 40–160)

## 2016-06-28 ENCOUNTER — Encounter: Payer: Self-pay | Admitting: *Deleted

## 2016-07-01 ENCOUNTER — Other Ambulatory Visit: Payer: Medicare Other

## 2016-07-04 ENCOUNTER — Encounter: Payer: Self-pay | Admitting: Nurse Practitioner

## 2016-07-04 ENCOUNTER — Ambulatory Visit (INDEPENDENT_AMBULATORY_CARE_PROVIDER_SITE_OTHER): Payer: Medicare Other | Admitting: Nurse Practitioner

## 2016-07-04 VITALS — BP 128/78 | HR 67 | Temp 98.2°F | Resp 17 | Ht 68.7 in | Wt 137.0 lb

## 2016-07-04 DIAGNOSIS — Z0000 Encounter for general adult medical examination without abnormal findings: Secondary | ICD-10-CM

## 2016-07-04 DIAGNOSIS — G309 Alzheimer's disease, unspecified: Secondary | ICD-10-CM

## 2016-07-04 DIAGNOSIS — E785 Hyperlipidemia, unspecified: Secondary | ICD-10-CM

## 2016-07-04 DIAGNOSIS — Z23 Encounter for immunization: Secondary | ICD-10-CM

## 2016-07-04 DIAGNOSIS — R634 Abnormal weight loss: Secondary | ICD-10-CM | POA: Diagnosis not present

## 2016-07-04 DIAGNOSIS — F0280 Dementia in other diseases classified elsewhere without behavioral disturbance: Secondary | ICD-10-CM

## 2016-07-04 NOTE — Progress Notes (Signed)
Provider: Lauree Chandler, NP  Patient Care Team: Lauree Chandler, NP as PCP - General (Nurse Practitioner)  Extended Emergency Contact Information Primary Emergency Contact: [name],Christian Address: [id number]          [place] [address] [name] of [place] Phone: [phone] Work Phone: [phone] Mobile Phone: [phone] Relation: Spouse Secondary Emergency Contact: Tanna [place] of [place] Phone: [phone] Mobile Phone: [phone] Relation: Friend Allergies  Allergen Reactions  . Morphine And Related     Per pt: unknown   Code Status: DNR Goals of Care: Advanced Directive information Advanced Directives 07/04/2016  Does patient have an advance directive? Yes  Type of Paramedic of [place];Out of facility DNR (pink MOST or yellow form);Living will  Does patient want to make changes to advanced directive? -  Copy of advanced directive(s) in chart? Yes  Would patient like information on creating an advanced directive? -     Chief Complaint  Patient presents with  . Medical Management of Chronic Issues    complete physical. failed clock drawing.   . Other    Wife, Christian, and friend, Christian [name] in room.     HPI: Patient is a 70 y.o. [name] seen in today for an annual wellness exam.  No major illnesses or hospitalization in the past year Diarrhea is better Still drinking a pot of coffee a day  Depression screen [facility] 2/9 07/04/2016 05/23/2016 12/22/2014 07/13/2013  Decreased Interest 0 0 0 0  Down, Depressed, Hopeless 0 0 0 0  PHQ - 2 Score 0 0 0 0    Fall Risk  07/04/2016 05/23/2016 12/26/2015 08/17/2015 06/29/2015  Falls in the past year? No No No No No   MMSE - Mini Mental State Exam 07/04/2016 06/29/2015 07/13/2013  Orientation to time 1 0 2  Orientation to Place 4 4 3   Registration 3 3 3   Attention/ Calculation 0 0 0  Recall 2 1 0  Language- name 2 objects 2 2 2   Language- repeat 1 1 1     Language- follow 3 step command 2 3 3   Language- read & follow direction 1 1 1   Write a sentence 0 0 1  Copy design 1 1 1   Total score 17 16 17    Needing more reminder about things.   Health Maintenance  Topic Date Due  . TETANUS/TDAP  01/10/1964  . ZOSTAVAX  01/09/2005  . PNA vac Low Risk Adult (2 of 2 - PPSV23) 12/23/2015  . INFLUENZA VACCINE  06/25/2016  . Hepatitis C Screening  06/24/2024 (Originally Sep 18, 1946)  . COLONOSCOPY  08/11/2018   Has Rx at home for tdap and zostavax but has not gotten them  Urinary incontinence? none Functional Status Survey: Is the patient deaf or have difficulty hearing?: No Does the patient have difficulty seeing, even when wearing glasses/contacts?: No Does the patient have difficulty concentrating, remembering, or making decisions?: Yes Does the patient have difficulty walking or climbing stairs?: No Does the patient have difficulty dressing or bathing?: No Does the patient have difficulty doing errands alone such as visiting a doctor's office or shopping?: Yes Exercise? Current Exercise Habits: The patient does not participate in regular exercise at present    Diet? Eats when he is told to eat, has to be encouraged due to dementia Weight has been stable.    Visual Acuity Screening   Right eye Left eye Both eyes  Without correction:     With correction: 20/30 20/40 20/40  Dentition: poor- only has 5 teeth- overdue for a cleaning  Pain: no pain  Past Medical History:  Diagnosis Date  . Alzheimer disease   . Diverticulitis   . Nodular prostate     Past Surgical History:  Procedure Laterality Date  . CARPAL TUNNEL WITH CUBITAL TUNNEL  2012   Lamar Benes, MD  . [place]   Maia Breslow, MD  . SQUAMOUS CELL CARCINOMA EXCISION      Social History   Social History  . Marital status: Unknown    Spouse name: N/A  . Number of children: N/A  . Years of education: N/A   Social History Main Topics  .  Smoking status: Current Every Day Smoker    Packs/day: 0.50    Years: 50.00    Types: Cigarettes  . Smokeless tobacco: Never Used     Comment: does not wish to stop smoking or talk about it. pt with hx of dementia and wife does not think this is worth the stress of stopping. pt does not want to stop  . Alcohol use 2.0 - 3.0 oz/week    4 - 6 Standard drinks or equivalent per week     Comment: occasionally- social drinker- does not drink every day  . Drug use: No  . Sexual activity: Not Asked   Other Topics Concern  . None   Social History Narrative  . None     Family History  Problem Relation Age of Onset  . Cancer Mother     bone  . Cancer Father     lung  . Dementia Father     Review of Systems:  Review of Systems  Constitutional: Negative for chills, fever and unexpected weight change.  Respiratory: Negative for cough and shortness of breath.   Cardiovascular: Negative for chest pain, palpitations and leg swelling.  Gastrointestinal: Negative for abdominal pain, constipation and diarrhea.       Frequent BMs  Genitourinary: Negative for dysuria and frequency.  Musculoskeletal: Negative for back pain and myalgias.  Skin: Negative.   Neurological: Negative for dizziness and headaches.  Psychiatric/Behavioral: Positive for confusion (memory loss).       Medication List       Accurate as of 07/04/16 11:02 AM. Always use your most recent med list.          citalopram 20 MG tablet Commonly known as:  CELEXA Take one tablet by mouth once daily for anxiety   fluticasone 50 MCG/ACT nasal spray Commonly known as:  FLONASE Place 2 sprays into both nostrils 2 (two) times daily as needed for allergies or rhinitis.   mirtazapine 15 MG tablet Commonly known as:  REMERON Take 1 tablet (15 mg total) by mouth at bedtime.   NAMZARIC 28-10 MG Cp24 Generic drug:  Memantine HCl-Donepezil HCl TAKE 1 CAPSULE BY MOUTH DAILY TO PRESERVE MEMORY         Physical  Exam: Vitals:   07/04/16 1026  BP: 128/78  Pulse: 67  Resp: 17  Temp: 98.2 F (36.8 C)  TempSrc: Oral  SpO2: 96%  Weight: 137 lb (62.1 kg)  Height: 5' 8.7" (1.745 m)   Body mass index is 20.41 kg/m. Physical Exam  Constitutional: No distress.  HENT:  Head: Normocephalic and atraumatic.  Neck: Normal range of motion. Neck supple.  Cardiovascular: Normal rate, regular rhythm and normal heart sounds.   Pulmonary/Chest: Effort normal and breath sounds normal.  Musculoskeletal: He exhibits no edema.  Neurological: He  is alert.  Skin: Skin is warm and dry. He is not diaphoretic.  Psychiatric: He has a normal mood and affect.    Labs reviewed: Basic Metabolic Panel:  Recent Labs  12/26/15 1053 06/04/16  NA 141 146  K 4.7 4.1  CL 99  --   CO2 25  --   GLUCOSE 85  --   BUN 9 12  CREATININE 0.82 0.8  CALCIUM 9.4  --    Liver Function Tests:  Recent Labs  06/04/16  AST 16  ALT 9*  ALKPHOS 98   No results for input(s): LIPASE, AMYLASE in the last 8760 hours. No results for input(s): AMMONIA in the last 8760 hours. CBC:  Recent Labs  06/04/16  WBC 7.9  HGB 14.2  HCT 43  PLT 236   Lipid Panel:  Recent Labs  06/27/16  CHOL 200  HDL 49  LDLCALC 136  TRIG 75   No results found for: HGBA1C  Procedures: No results found.  Assessment/Plan 1. Medicare annual wellness visit, subsequent -no new issues today.  -  The patient was counseled regarding the appropriate use of alcohol, regular self-examination of the breasts on a monthly basis, prevention of dental and periodontal disease, diet, regular sustained exercise for at least 30 minutes 5 times per week,  basis,smoking cessation, tobacco use,  and recommended schedule for GI hemoccult testing, colonoscopy, cholesterol, thyroid and diabetes screening. -pt does not wish to quit smoking or drinking alcohol but discussed need for cessation -to follow up with dentist -MMSE 17/30 which has been  stable -encouraged exercise or activity program -to cut back on caffeine   2. Hyperlipidemia LDL goal <130 -LDL 136, eating better because caregiver cooking for him. Discussed better food choices to help lower cholesterol but not to cut back on calories due to weight loss -will monitor LDL at this time - COMPLETE METABOLIC PANEL WITH GFR; Future - Lipid panel; Future  3. Loss of weight Weight has been stable, conts on remeron   4. Alzheimer disease MMSE 17/30 which has been stable. conts to have caregiver with him during the day -conts on namzaric  5. Need for pneumococcal vaccine - Pneumococcal polysaccharide vaccine 23-valent greater than or equal to 2yo subcutaneous/IM

## 2016-07-04 NOTE — Patient Instructions (Addendum)
Cholesterol is elevated Encourage proper nutrition To cut back on smoking Decrease amount of caffeine and coffee- would encourage decaf coffee if he wants more than 1 cup daily To stop drinking as it can increase falls and make memory worse  Heart-Healthy Eating Plan Many factors influence your heart health, including eating and exercise habits. Heart (coronary) risk increases with abnormal blood fat (lipid) levels. Heart-healthy meal planning includes limiting unhealthy fats, increasing healthy fats, and making other small dietary changes. This includes maintaining a healthy body weight to help keep lipid levels within a normal range.  WHAT TYPES OF FAT SHOULD I CHOOSE?  Choose healthy fats more often. Choose monounsaturated and polyunsaturated fats, such as olive oil and canola oil, flaxseeds, walnuts, almonds, and seeds.  Eat more omega-3 fats. Good choices include salmon, mackerel, sardines, tuna, flaxseed oil, and ground flaxseeds. Aim to eat fish at least two times each week.  Limit saturated fats. Saturated fats are primarily found in animal products, such as meats, butter, and cream. Plant sources of saturated fats include palm oil, palm kernel oil, and coconut oil.  Avoid foods with partially hydrogenated oils in them. These contain trans fats. Examples of foods that contain trans fats are stick margarine, some tub margarines, cookies, crackers, and other baked goods. WHAT GENERAL GUIDELINES DO I NEED TO FOLLOW?  Check food labels carefully to identify foods with trans fats or high amounts of saturated fat.  Fill one half of your plate with vegetables and green salads. Eat 4-5 servings of vegetables per day. A serving of vegetables equals 1 cup of raw leafy vegetables,  cup of raw or cooked cut-up vegetables, or  cup of vegetable juice.  Fill one fourth of your plate with whole grains. Look for the word "whole" as the first word in the ingredient list.  Fill one fourth of your  plate with lean protein foods.  Eat 4-5 servings of fruit per day. A serving of fruit equals one medium whole fruit,  cup of dried fruit,  cup of fresh, frozen, or canned fruit, or  cup of 100% fruit juice.  Eat more foods that contain soluble fiber. Examples of foods that contain this type of fiber are apples, broccoli, carrots, beans, peas, and barley. Aim to get 20-30 g of fiber per day.  Eat more home-cooked food and less restaurant, buffet, and fast food.  Limit or avoid alcohol.  Limit foods that are high in starch and sugar.  Avoid fried foods.  Cook foods by using methods other than frying. Baking, boiling, grilling, and broiling are all great options. Other fat-reducing suggestions include:  Removing the skin from poultry.  Removing all visible fats from meats.  Skimming the fat off of stews, soups, and gravies before serving them.  Steaming vegetables in water or broth.  Lose weight if you are overweight. Losing just 5-10% of your initial body weight can help your overall health and prevent diseases such as diabetes and heart disease.  Increase your consumption of nuts, legumes, and seeds to 4-5 servings per week. One serving of dried beans or legumes equals  cup after being cooked, one serving of nuts equals 1 ounces, and one serving of seeds equals  ounce or 1 tablespoon.  You may need to monitor your salt (sodium) intake, especially if you have high blood pressure. Talk with your health care provider or dietitian to get more information about reducing sodium. WHAT FOODS CAN I EAT? Grains Breads, including [place], white, pita, wheat, raisin,  rye, oatmeal, and [place]. Tortillas that are neither fried nor made with lard or trans fat. Low-fat rolls, including hotdog and hamburger buns and English muffins. Biscuits. Muffins. Waffles. Pancakes. Light popcorn. Whole-grain cereals. Flatbread. Melba toast. Pretzels. Breadsticks. Rusks. Low-fat snacks and crackers, including  oyster, saltine, matzo, graham, animal, and rye. Rice and pasta, including brown rice and those that are made with whole wheat. Vegetables All vegetables. Fruits All fruits, but limit coconut. Meats and Other Protein Sources Lean, well-trimmed beef, veal, pork, and lamb. Chicken and [place] without skin. All fish and shellfish. Wild duck, rabbit, pheasant, and venison. Egg whites or low-cholesterol egg substitutes. Dried beans, peas, lentils, and tofu.Seeds and most nuts. Dairy Low-fat or nonfat cheeses, including ricotta, string, and mozzarella. Skim or 1% milk that is liquid, powdered, or evaporated. Buttermilk that is made with low-fat milk. Nonfat or low-fat yogurt. Beverages Mineral water. Diet carbonated beverages. Sweets and Desserts Sherbets and fruit ices. Honey, jam, marmalade, jelly, and syrups. Meringues and gelatins. Pure sugar candy, such as hard candy, jelly beans, gumdrops, mints, marshmallows, and small amounts of dark chocolate. [employer] Eat all sweets and desserts in moderation. Fats and Oils Nonhydrogenated (trans-free) margarines. Vegetable oils, including soybean, sesame, sunflower, olive, peanut, safflower, corn, canola, and cottonseed. Salad dressings or mayonnaise that are made with a vegetable oil. Limit added fats and oils that you use for cooking, baking, salads, and as spreads. Other Cocoa powder. Coffee and tea. All seasonings and condiments. The items listed above may not be a complete list of recommended foods or beverages. Contact your dietitian for more options. WHAT FOODS ARE NOT RECOMMENDED? Grains Breads that are made with saturated or trans fats, oils, or whole milk. Croissants. Butter rolls. Cheese breads. Sweet rolls. Donuts. Buttered popcorn. Chow mein noodles. High-fat crackers, such as cheese or butter crackers. Meats and Other Protein Sources Fatty meats, such as hotdogs, short ribs, sausage, spareribs, bacon, ribeye roast or steak, and  mutton. High-fat deli meats, such as salami and bologna. Caviar. Domestic duck and goose. Organ meats, such as kidney, liver, sweetbreads, brains, gizzard, chitterlings, and heart. Dairy Cream, sour cream, cream cheese, and creamed cottage cheese. Whole milk cheeses, including blue (bleu), Monterey Jack, [place], [place], American, [place], Swiss, Garvin, [place], and [place]. Whole or 2% milk that is liquid, evaporated, or condensed. Whole buttermilk. Cream sauce or high-fat cheese sauce. Yogurt that is made from whole milk. Beverages Regular sodas and drinks with added sugar. Sweets and Desserts Frosting. Pudding. Cookies. Cakes other than angel food cake. Candy that has milk chocolate or white chocolate, hydrogenated fat, butter, coconut, or unknown ingredients. Buttered syrups. Full-fat ice cream or ice cream drinks. Fats and Oils Gravy that has suet, meat fat, or shortening. Cocoa butter, hydrogenated oils, palm oil, coconut oil, palm kernel oil. These can often be found in baked products, candy, fried foods, nondairy creamers, and whipped toppings. Solid fats and shortenings, including bacon fat, salt pork, lard, and butter. Nondairy cream substitutes, such as coffee creamers and sour cream substitutes. Salad dressings that are made of unknown oils, cheese, or sour cream. The items listed above may not be a complete list of foods and beverages to avoid. Contact your dietitian for more information.   This information is not intended to replace advice given to you by your health care provider. Make sure you discuss any questions you have with your health care provider.   Document Released: 08/20/2008 Document Revised: 12/02/2014 Document Reviewed: 05/05/2014 Elsevier Interactive Patient Education 2016  [employer].

## 2016-07-05 ENCOUNTER — Encounter: Payer: Self-pay | Admitting: Nurse Practitioner

## 2016-08-19 ENCOUNTER — Other Ambulatory Visit: Payer: Self-pay | Admitting: *Deleted

## 2016-08-19 DIAGNOSIS — R634 Abnormal weight loss: Secondary | ICD-10-CM

## 2016-08-19 MED ORDER — MIRTAZAPINE 15 MG PO TABS: 15.0000 mg | ORAL_TABLET | Freq: Every day | ORAL | 1 refills | Status: DC

## 2016-08-19 MED ORDER — CITALOPRAM HYDROBROMIDE 20 MG PO TABS: ORAL_TABLET | ORAL | 1 refills | Status: DC

## 2016-08-19 NOTE — Telephone Encounter (Signed)
[facility] 

## 2016-08-21 ENCOUNTER — Other Ambulatory Visit: Payer: Self-pay | Admitting: *Deleted

## 2016-08-21 DIAGNOSIS — R634 Abnormal weight loss: Secondary | ICD-10-CM

## 2016-08-21 MED ORDER — MIRTAZAPINE 15 MG PO TABS: 15.0000 mg | ORAL_TABLET | Freq: Every day | ORAL | 3 refills | Status: DC

## 2016-08-21 MED ORDER — CITALOPRAM HYDROBROMIDE 20 MG PO TABS: ORAL_TABLET | ORAL | 3 refills | Status: DC

## 2016-08-21 NOTE — Telephone Encounter (Signed)
[facility] 

## 2016-09-13 ENCOUNTER — Other Ambulatory Visit: Payer: Self-pay | Admitting: *Deleted

## 2016-09-13 MED ORDER — MEMANTINE HCL-DONEPEZIL HCL ER 28-10 MG PO CP24: 1.0000 | ORAL_CAPSULE | Freq: Every day | ORAL | 1 refills | Status: DC

## 2016-09-13 NOTE — Telephone Encounter (Signed)
[facility]

## 2016-09-24 ENCOUNTER — Encounter: Payer: Self-pay | Admitting: Nurse Practitioner

## 2016-11-07 ENCOUNTER — Encounter: Payer: Self-pay | Admitting: Nurse Practitioner

## 2016-11-07 ENCOUNTER — Ambulatory Visit (INDEPENDENT_AMBULATORY_CARE_PROVIDER_SITE_OTHER): Payer: Medicare Other | Admitting: Nurse Practitioner

## 2016-11-07 VITALS — BP 132/82 | HR 65 | Temp 98.4°F | Resp 18 | Ht 69.0 in | Wt 141.8 lb

## 2016-11-07 DIAGNOSIS — Z23 Encounter for immunization: Secondary | ICD-10-CM

## 2016-11-07 DIAGNOSIS — K219 Gastro-esophageal reflux disease without esophagitis: Secondary | ICD-10-CM

## 2016-11-07 DIAGNOSIS — F0281 Dementia in other diseases classified elsewhere with behavioral disturbance: Secondary | ICD-10-CM

## 2016-11-07 DIAGNOSIS — E785 Hyperlipidemia, unspecified: Secondary | ICD-10-CM

## 2016-11-07 DIAGNOSIS — G300 Alzheimer's disease with early onset: Secondary | ICD-10-CM

## 2016-11-07 DIAGNOSIS — F411 Generalized anxiety disorder: Secondary | ICD-10-CM

## 2016-11-07 LAB — COMPLETE METABOLIC PANEL WITH GFR
ALT: 18 U/L (ref 9–46)
AST: 19 U/L (ref 10–35)
Albumin: 4 g/dL (ref 3.6–5.1)
Alkaline Phosphatase: 73 U/L (ref 40–115)
BUN: 12 mg/dL (ref 7–25)
CALCIUM: 9.5 mg/dL (ref 8.6–10.3)
CHLORIDE: 103 mmol/L (ref 98–110)
CO2: 27 mmol/L (ref 20–31)
Creat: 0.88 mg/dL (ref 0.70–1.18)
GFR, EST NON AFRICAN AMERICAN: 86 mL/min (ref >=60)
Glucose, Bld: 49 mg/dL — ABNORMAL LOW (ref 65–99)
POTASSIUM: 4.6 mmol/L (ref 3.5–5.3)
Sodium: 140 mmol/L (ref 135–146)
Total Bilirubin: 0.5 mg/dL (ref 0.2–1.2)
Total Protein: 6.6 g/dL (ref 6.1–8.1)

## 2016-11-07 LAB — TSH: TSH: 1.54 m[IU]/L (ref 0.40–4.50)

## 2016-11-07 MED ORDER — SERTRALINE HCL 50 MG PO TABS: 50.0000 mg | ORAL_TABLET | Freq: Every day | ORAL | 1 refills | Status: DC

## 2016-11-07 NOTE — Patient Instructions (Addendum)
Stop taking Celexa and begin Zoloft 50 mg once daily. Do not adjust medication without consulting provider  Will get labs today  Rx given for fasting lab work.

## 2016-11-07 NOTE — Progress Notes (Signed)
Careteam: Patient Care Team: Lauree Chandler, NP as PCP - General (Nurse Practitioner)  Advanced Directive information Does Patient Have a Medical Advance Directive?: Yes, Type of Advance Directive: Healthcare Power of Attorney  Allergies  Allergen Reactions  . Morphine And Related     Per pt: unknown    Chief Complaint  Patient presents with  . Medical Management of Chronic Issues    4 month follow up.   . Other    Wants flu shot.     HPI: Patient is a 70 y.o. male seen in the office today for a routine follow-up of cholesterol and weight.  Non-fasting today. Would like labs to be completed at [facility] in [place].  Patient having increased anxiety that the friend reports he feels is due to the patient's inability to get this thoughts out - the patient was in agreement with this statement. Due to the increased anxiety the wife has been giving the patient 2 of the 20 mg Celexa daily for the past 3 weeks.   Wife and friend took the patient off his Remeron months ago as they felt he slept well enough without it and they did not see any increase in his appetite. The friend reports he never took it nightly anyway as he frequently refused to take it. The friend reports the patient eats well and has been gaining weight.  Denies any problems with GERD.  Rarely drinking alcohol now that it is cold weather. Continues to smoke and not willing to quit. The friend reports he has cut his smoking in half as he has to go outside to smoke and does not go out as much due to the cold weather.   Review of Systems:  Review of Systems  Constitutional: Negative for activity change, appetite change, chills, fever and unexpected weight change.  Respiratory: Negative for cough and shortness of breath.   Cardiovascular: Negative for chest pain, palpitations and leg swelling.  Gastrointestinal: Negative for abdominal pain, constipation and diarrhea.  Genitourinary: Negative for dysuria and  frequency.  Musculoskeletal: Negative for back pain and myalgias.  Skin: Negative.   Neurological: Negative for dizziness and headaches.  Psychiatric/Behavioral: Positive for confusion (memory loss).    Past Medical History:  Diagnosis Date  . Alzheimer disease   . Diverticulitis   . Nodular prostate    Past Surgical History:  Procedure Laterality Date  . CARPAL TUNNEL WITH CUBITAL TUNNEL  2012   Lamar Benes, MD  . [place]   Maia Breslow, MD  . SQUAMOUS CELL CARCINOMA EXCISION     Social History:   reports that he has been smoking Cigarettes.  He has a 25.00 pack-year smoking history. He has never used smokeless tobacco. He reports that he drinks about 2.0 - 3.0 oz of alcohol per week . He reports that he does not use drugs.  Family History  Problem Relation Age of Onset  . Cancer Mother     bone  . Cancer Father     lung  . Dementia Father     Medications: Patient's Medications  New Prescriptions   SERTRALINE (ZOLOFT) 50 MG TABLET    Take 1 tablet (50 mg total) by mouth daily.  Previous Medications   FLUTICASONE (FLONASE) 50 MCG/ACT NASAL SPRAY    Place 2 sprays into both nostrils 2 (two) times daily as needed for allergies or rhinitis.   MEMANTINE HCL-DONEPEZIL HCL (NAMZARIC) 28-10 MG CP24    Take 1  capsule by mouth daily.  Modified Medications   No medications on file  Discontinued Medications   CITALOPRAM (CELEXA) 20 MG TABLET    Take one tablet by mouth once daily for anxiety   MIRTAZAPINE (REMERON) 15 MG TABLET    Take 1 tablet (15 mg total) by mouth at bedtime.     Physical Exam:  Vitals:   11/07/16 1012  BP: 132/82  Pulse: 65  Resp: 18  Temp: 98.4 F (36.9 C)  TempSrc: Oral  SpO2: 97%  Weight: 141 lb 12.8 oz (64.3 kg)  Height: 5' 9"  (1.753 m)   Body mass index is 20.94 kg/m.  Physical Exam  Constitutional: He appears well-developed and well-nourished. No distress.  HENT:  Head: Normocephalic and atraumatic.  Neck: Normal  range of motion. Neck supple.  Cardiovascular: Normal rate, regular rhythm and normal heart sounds.   Pulmonary/Chest: Effort normal and breath sounds normal.  Abdominal: Soft. Bowel sounds are normal.  Musculoskeletal: He exhibits no edema.  Neurological: He is alert.  Skin: Skin is warm and dry. He is not diaphoretic.  Psychiatric: He has a normal mood and affect. His behavior is normal. Judgment normal. Cognition and memory are impaired.   Labs reviewed: Basic Metabolic Panel:  Recent Labs  12/26/15 1053 06/04/16  NA 141 146  K 4.7 4.1  CL 99  --   CO2 25  --   GLUCOSE 85  --   BUN 9 12  CREATININE 0.82 0.8  CALCIUM 9.4  --    Liver Function Tests:  Recent Labs  06/04/16  AST 16  ALT 9*  ALKPHOS 98   No results for input(s): LIPASE, AMYLASE in the last 8760 hours. No results for input(s): AMMONIA in the last 8760 hours. CBC:  Recent Labs  06/04/16  WBC 7.9  HGB 14.2  HCT 43  PLT 236   Lipid Panel:  Recent Labs  06/27/16  CHOL 200  HDL 49  LDLCALC 136  TRIG 75   TSH: No results for input(s): TSH in the last 8760 hours. A1C: No results found for: HGBA1C   Assessment/Plan 1. Anxiety state - Worsening. Wife has been giving patient Celexa 40 mg instead of the 20 mg that is prescribed. Education provided that medication should not be adjusted without consulting the provider. Discontinuing Celexa and beginning Zoloft since Celexa at 20 mg was not effective.  - sertraline (ZOLOFT) 50 MG tablet; Take 1 tablet (50 mg total) by mouth daily.  Dispense: 30 tablet; Refill: 1 - CMP with eGFR - TSH  2. Early onset Alzheimer's disease with behavioral disturbance - Gradual worsening. Changing Celexa to Zoloft to better control accompanying agitation.  - Continue Namzaric as prescribed.  - Advised that drinking alcohol can worsen memory loss.   3. Need for immunization against influenza - Flu Vaccine QUAD 36+ mos PF IM (Fluarix & Fluzone Quad Pf) given in  office  4. Gastroesophageal reflux disease without esophagitis - Dietary controlled.  5. Hyperlipidemia, unspecified hyperlipidemia type - Prescription given for a lipid panel so that the patient can obtain lab work at [facility] in [place] as he is not fasting today and this was requested instead of returning here for fasting labs.  - Continue healthy eating as patient has someone cooking for him now. - CMP with eGFR  Follow-up in 6 weeks for anxiety and cholesterol.  Carlos American. Harle Battiest  [facility] [phone] 8 am - 5 pm) [phone] (after hours)

## 2016-11-11 ENCOUNTER — Other Ambulatory Visit: Payer: Self-pay | Admitting: Nurse Practitioner

## 2016-11-11 DIAGNOSIS — E785 Hyperlipidemia, unspecified: Secondary | ICD-10-CM | POA: Diagnosis not present

## 2016-11-12 LAB — LIPID PANEL W/O CHOL/HDL RATIO
CHOLESTEROL TOTAL: 209 mg/dL — AB (ref 100–199)
HDL: 51 mg/dL (ref >39)
LDL Calculated: 140 mg/dL — ABNORMAL HIGH (ref 0–99)
TRIGLYCERIDES: 90 mg/dL (ref 0–149)
VLDL Cholesterol Cal: 18 mg/dL (ref 5–40)

## 2016-11-13 ENCOUNTER — Other Ambulatory Visit: Payer: Self-pay | Admitting: Internal Medicine

## 2016-11-29 ENCOUNTER — Encounter: Payer: Self-pay | Admitting: Nurse Practitioner

## 2016-12-02 MED ORDER — MEMANTINE HCL-DONEPEZIL HCL ER 28-10 MG PO CP24: 1.0000 | ORAL_CAPSULE | Freq: Every day | ORAL | 1 refills | Status: AC

## 2016-12-19 ENCOUNTER — Ambulatory Visit (INDEPENDENT_AMBULATORY_CARE_PROVIDER_SITE_OTHER): Payer: Medicare Other | Admitting: Internal Medicine

## 2016-12-19 ENCOUNTER — Encounter: Payer: Self-pay | Admitting: Internal Medicine

## 2016-12-19 VITALS — BP 148/80 | HR 63 | Temp 98.4°F | Wt 143.0 lb

## 2016-12-19 DIAGNOSIS — J302 Other seasonal allergic rhinitis: Secondary | ICD-10-CM

## 2016-12-19 DIAGNOSIS — Z720 Tobacco use: Secondary | ICD-10-CM | POA: Diagnosis not present

## 2016-12-19 DIAGNOSIS — F02818 Dementia in other diseases classified elsewhere, unspecified severity, with other behavioral disturbance: Secondary | ICD-10-CM

## 2016-12-19 DIAGNOSIS — F0281 Dementia in other diseases classified elsewhere with behavioral disturbance: Secondary | ICD-10-CM

## 2016-12-19 DIAGNOSIS — E785 Hyperlipidemia, unspecified: Secondary | ICD-10-CM | POA: Diagnosis not present

## 2016-12-19 DIAGNOSIS — G300 Alzheimer's disease with early onset: Secondary | ICD-10-CM

## 2016-12-19 DIAGNOSIS — K219 Gastro-esophageal reflux disease without esophagitis: Secondary | ICD-10-CM | POA: Diagnosis not present

## 2016-12-19 NOTE — Progress Notes (Signed)
Location:  [facility] clinic Provider:  Bellarae Lizer L. Mariea Clonts, D.O., C.M.D.  Code Status: DNR Goals of Care:  Advanced Directives 11/07/2016  Does Patient Have a Medical Advance Directive? Yes  Type of Advance Directive [place]  Does patient want to make changes to medical advance directive? -  Copy of [place] in Chart? Yes  Would patient like information on creating a medical advance directive? -   Chief Complaint  Patient presents with  . Medical Management of Chronic Issues    6 week follow-up    HPI: Patient is a 71 y.o. male seen today for medical management of chronic diseases.    Anxiety:  He's doing much better on zoloft.  He gets a little frustrated that he can't remember and then laughs about it.  Doesn't want to cry.    Alzheimer's Disease:  Diagnosed in 2009 by neuropsychologist in [place].  Slow and gradual onset, but accelerated in past year.  Says he doesn't have a brain.  Memory poor.  He eats well.  Gained one lb since last visit and had gained 4 lbs from the previous time.  No leftovers anymore.  Has difficulty with word finding, short term memory.  No problems with daily routine.  His friend takes care of the morning pills.  He also helps him with his appts.  Friend and pt's wife attend a support group in the community--an individual associated with [employer] runs it.  Pt does not go--he stays home with the dog.  Even-tempered.  They are trying to get him hooked up with VA benefits.    GERD:  No problems lately.    Tobacco abuse:  Still smoking--less than 1/2ppd b/c too cold to go outside.    Alcohol--only a beer per week.    No colds.  Feels well.  No pain.    Uses flonase only in early fall and spring for allergies, not otherwise.    Cholesterol elevated.  Uses whole milk for cereal, [employer] ensure.   Two ham sandwiches at lunch.  A lot of chicken.  His wife is diabetic so they've had to cut back on the pasta and white  products.  Discussed quality of life.    Past Medical History:  Diagnosis Date  . Alzheimer disease   . Diverticulitis   . Nodular prostate     Past Surgical History:  Procedure Laterality Date  . CARPAL TUNNEL WITH CUBITAL TUNNEL  2012   Lamar Benes, MD  . [place]   Maia Breslow, MD  . SQUAMOUS CELL CARCINOMA EXCISION      Allergies  Allergen Reactions  . Morphine And Related     Per pt: unknown    Allergies as of 12/19/2016      Reactions   Morphine And Related    Per pt: unknown      Medication List       Accurate as of 12/19/16  8:52 AM. Always use your most recent med list.          fluticasone 50 MCG/ACT nasal spray Commonly known as:  FLONASE Place 2 sprays into both nostrils 2 (two) times daily as needed for allergies or rhinitis.   Memantine HCl-Donepezil HCl 28-10 MG Cp24 Commonly known as:  NAMZARIC Take 1 capsule by mouth daily.   sertraline 50 MG tablet Commonly known as:  ZOLOFT Take 1 tablet (50 mg total) by mouth daily.       Review  of Systems:  Review of Systems  Constitutional: Negative for chills, fever, malaise/fatigue and weight loss.  HENT: Negative for congestion.   Eyes: Negative for blurred vision.       Glasses  Respiratory: Negative for shortness of breath.   Cardiovascular: Negative for chest pain, palpitations and leg swelling.  Gastrointestinal: Negative for abdominal pain, blood in stool, constipation, diarrhea, heartburn and melena.  Genitourinary: Negative for dysuria.  Musculoskeletal: Negative for back pain, falls and joint pain.  Skin: Negative for itching and rash.  Neurological: Positive for tremors. Negative for dizziness, focal weakness, loss of consciousness, weakness and headaches.       Some action tremor and decreased coordination when cutting food or doing small hand items  Psychiatric/Behavioral: Positive for memory loss. Negative for depression. The patient is nervous/anxious. The  patient does not have insomnia.     Health Maintenance  Topic Date Due  . TETANUS/TDAP  01/10/1964  . ZOSTAVAX  01/09/2005  . Hepatitis C Screening  06/24/2024 (Originally [birth year])  . COLONOSCOPY  08/11/2018  . INFLUENZA VACCINE  Completed  . PNA vac Low Risk Adult  Completed    Physical Exam: Vitals:   12/19/16 0839  BP: (!) 148/80  Pulse: 63  Temp: 98.4 F (36.9 C)  TempSrc: Oral  SpO2: 95%  Weight: 143 lb (64.9 kg)   Body mass index is 21.12 kg/m. Physical Exam  Constitutional: No distress.  Cardiovascular: Normal rate, regular rhythm, normal heart sounds and intact distal pulses.   Pulmonary/Chest: Effort normal and breath sounds normal. No respiratory distress.  Abdominal: Soft. Bowel sounds are normal.  Musculoskeletal: Normal range of motion.  Neurological: He is alert. No sensory deficit. He exhibits normal muscle tone. Coordination normal.  Oriented to person and place; expressive aphasia  Skin: Skin is warm and dry.  Psychiatric: He has a normal mood and affect.   Labs reviewed: Basic Metabolic Panel:  Recent Labs  12/26/15 1053 06/04/16 11/07/16 1128  NA 141 146 140  K 4.7 4.1 4.6  CL 99  --  103  CO2 25  --  27  GLUCOSE 85  --  49*  BUN 9 12 12   CREATININE 0.82 0.8 0.88  CALCIUM 9.4  --  9.5  TSH  --   --  1.54   Liver Function Tests:  Recent Labs  06/04/16 11/07/16 1128  AST 16 19  ALT 9* 18  ALKPHOS 98 73  BILITOT  --  0.5  PROT  --  6.6  ALBUMIN  --  4.0   No results for input(s): LIPASE, AMYLASE in the last 8760 hours. No results for input(s): AMMONIA in the last 8760 hours. CBC:  Recent Labs  06/04/16  WBC 7.9  HGB 14.2  HCT 43  PLT 236   Lipid Panel:  Recent Labs  06/27/16 11/11/16 0901  CHOL 200 209*  HDL 49 51  LDLCALC 136 140*  TRIG 75 90   Assessment/Plan 1. Early onset Alzheimer's disease with behavioral disturbance -ongoing -still doing his own ADLs, but has help and supervision from friend and  wife MMSE - Nathalie Exam 07/04/2016 06/29/2015 07/13/2013  Orientation to time 1 0 2  Orientation to Place 4 4 3   Registration 3 3 3   Attention/ Calculation 0 0 0  Recall 2 1 0  Language- name 2 objects 2 2 2   Language- repeat 1 1 1   Language- follow 3 step command 2 3 3   Language- read & follow direction 1 1  1  Write a sentence 0 0 1  Copy design 1 1 1   Total score 17 16 17    2. Gastroesophageal reflux disease without esophagitis -no longer on regular meds for this and not c/o symptoms  3. Chronic seasonal allergic rhinitis, unspecified trigger -cont flonase as needed in spring and fall  4. Tobacco abuse -ongoing, not willing to quit, smokes less in winter  5. Hyperlipidemia LDL goal <130 -lipids above goal as they're 140, but goal is qol as discussed with pt and friend  Labs/tests ordered:  No orders of the defined types were placed in this encounter.  Next appt:  3 mos with Janett Billow  Aniesa Boback L. Sienna Stonehocker, D.O. [facility] [address], [place] [address] Cell Phone (Mon-Fri 8am-5pm):  [phone] On Call:  [phone] & follow prompts after 5pm & weekends Office Phone:  [phone] Office Fax:  [phone]

## 2017-01-09 ENCOUNTER — Other Ambulatory Visit: Payer: Self-pay | Admitting: Nurse Practitioner

## 2017-01-09 DIAGNOSIS — F411 Generalized anxiety disorder: Secondary | ICD-10-CM

## 2017-02-18 ENCOUNTER — Other Ambulatory Visit: Payer: Self-pay | Admitting: *Deleted

## 2017-02-18 DIAGNOSIS — F411 Generalized anxiety disorder: Secondary | ICD-10-CM

## 2017-02-18 MED ORDER — SERTRALINE HCL 50 MG PO TABS: 50.0000 mg | ORAL_TABLET | Freq: Every day | ORAL | 1 refills | Status: AC

## 2017-02-18 NOTE — Telephone Encounter (Signed)
[facility]

## 2017-02-20 ENCOUNTER — Telehealth: Payer: Self-pay | Admitting: Nurse Practitioner

## 2017-02-20 NOTE — Telephone Encounter (Signed)
Called to schedule AWV that's due in Aug 2018. Pt's wife Holley Raring declined at this time. States pt has just been approved for VA benefits and will give them a chance but wants to keep his records active w/ Janett Billow just in case. VDM (DD)

## 2017-03-24 ENCOUNTER — Ambulatory Visit: Payer: Medicare Other | Admitting: Nurse Practitioner

## 2018-05-19 ENCOUNTER — Encounter: Payer: Self-pay | Admitting: Internal Medicine

## 2019-03-26 DEATH — deceased
